# Patient Record
Sex: Female | Born: 1980 | Hispanic: No | Marital: Single | Smoking: Former smoker
Health system: Southern US, Community
[De-identification: ages and names within clinical notes are randomized; demographics above are authoritative.]

## PROBLEM LIST (undated history)

## (undated) DIAGNOSIS — F1910 Other psychoactive substance abuse, uncomplicated: Secondary | ICD-10-CM

## (undated) DIAGNOSIS — I10 Essential (primary) hypertension: Secondary | ICD-10-CM

## (undated) DIAGNOSIS — R011 Cardiac murmur, unspecified: Secondary | ICD-10-CM

## (undated) DIAGNOSIS — I38 Endocarditis, valve unspecified: Secondary | ICD-10-CM

## (undated) HISTORY — PX: CHOLECYSTECTOMY: SHX55

## (undated) HISTORY — PX: MOUTH SURGERY: SHX715

## (undated) HISTORY — PX: TUBAL LIGATION: SHX77

---

## 2008-03-30 ENCOUNTER — Inpatient Hospital Stay (HOSPITAL_COMMUNITY): Admission: AD | Admit: 2008-03-30 | Discharge: 2008-03-30 | Payer: Self-pay | Admitting: Obstetrics and Gynecology

## 2009-08-06 ENCOUNTER — Emergency Department (HOSPITAL_BASED_OUTPATIENT_CLINIC_OR_DEPARTMENT_OTHER): Admission: EM | Admit: 2009-08-06 | Discharge: 2009-08-06 | Payer: Self-pay | Admitting: Emergency Medicine

## 2010-07-26 ENCOUNTER — Emergency Department (HOSPITAL_COMMUNITY)
Admission: EM | Admit: 2010-07-26 | Discharge: 2010-07-26 | Payer: Self-pay | Source: Home / Self Care | Admitting: Emergency Medicine

## 2010-12-22 ENCOUNTER — Emergency Department (HOSPITAL_COMMUNITY)
Admission: EM | Admit: 2010-12-22 | Discharge: 2010-12-22 | Disposition: A | Discharge status: Home / Self Care | Payer: Self-pay | Attending: Emergency Medicine | Admitting: Emergency Medicine

## 2010-12-22 DIAGNOSIS — R1310 Dysphagia, unspecified: Secondary | ICD-10-CM | POA: Insufficient documentation

## 2010-12-22 DIAGNOSIS — X58XXXA Exposure to other specified factors, initial encounter: Secondary | ICD-10-CM | POA: Insufficient documentation

## 2010-12-22 DIAGNOSIS — K0381 Cracked tooth: Secondary | ICD-10-CM | POA: Insufficient documentation

## 2010-12-22 DIAGNOSIS — F319 Bipolar disorder, unspecified: Secondary | ICD-10-CM | POA: Insufficient documentation

## 2010-12-22 DIAGNOSIS — Z79899 Other long term (current) drug therapy: Secondary | ICD-10-CM | POA: Insufficient documentation

## 2010-12-22 DIAGNOSIS — K029 Dental caries, unspecified: Secondary | ICD-10-CM | POA: Insufficient documentation

## 2010-12-22 DIAGNOSIS — S025XXA Fracture of tooth (traumatic), initial encounter for closed fracture: Secondary | ICD-10-CM | POA: Insufficient documentation

## 2010-12-22 DIAGNOSIS — I10 Essential (primary) hypertension: Secondary | ICD-10-CM | POA: Insufficient documentation

## 2011-01-22 ENCOUNTER — Emergency Department (HOSPITAL_COMMUNITY)
Admission: EM | Admit: 2011-01-22 | Discharge: 2011-01-22 | Discharge status: Against Medical Advice | Payer: Self-pay | Attending: Emergency Medicine | Admitting: Emergency Medicine

## 2011-01-22 DIAGNOSIS — Z0389 Encounter for observation for other suspected diseases and conditions ruled out: Secondary | ICD-10-CM | POA: Insufficient documentation

## 2011-02-03 ENCOUNTER — Emergency Department (HOSPITAL_COMMUNITY)
Admission: EM | Admit: 2011-02-03 | Discharge: 2011-02-03 | Disposition: A | Discharge status: Home / Self Care | Payer: No Typology Code available for payment source | Attending: Emergency Medicine | Admitting: Emergency Medicine

## 2011-02-03 DIAGNOSIS — Z79899 Other long term (current) drug therapy: Secondary | ICD-10-CM | POA: Insufficient documentation

## 2011-02-03 DIAGNOSIS — I10 Essential (primary) hypertension: Secondary | ICD-10-CM | POA: Insufficient documentation

## 2011-02-03 DIAGNOSIS — R51 Headache: Secondary | ICD-10-CM | POA: Insufficient documentation

## 2011-02-03 DIAGNOSIS — M542 Cervicalgia: Secondary | ICD-10-CM | POA: Insufficient documentation

## 2011-02-03 DIAGNOSIS — F319 Bipolar disorder, unspecified: Secondary | ICD-10-CM | POA: Insufficient documentation

## 2011-03-01 ENCOUNTER — Inpatient Hospital Stay (INDEPENDENT_AMBULATORY_CARE_PROVIDER_SITE_OTHER)
Admission: RE | Admit: 2011-03-01 | Discharge: 2011-03-01 | Disposition: A | Discharge status: Home / Self Care | Payer: Self-pay | Source: Ambulatory Visit | Attending: Family Medicine | Admitting: Family Medicine

## 2011-03-01 DIAGNOSIS — K089 Disorder of teeth and supporting structures, unspecified: Secondary | ICD-10-CM

## 2011-03-19 ENCOUNTER — Emergency Department (HOSPITAL_COMMUNITY)
Admission: EM | Admit: 2011-03-19 | Discharge: 2011-03-19 | Disposition: A | Discharge status: Home / Self Care | Payer: Self-pay | Attending: Emergency Medicine | Admitting: Emergency Medicine

## 2011-03-19 ENCOUNTER — Emergency Department (HOSPITAL_COMMUNITY): Payer: Self-pay

## 2011-03-19 DIAGNOSIS — R1011 Right upper quadrant pain: Secondary | ICD-10-CM | POA: Insufficient documentation

## 2011-03-19 DIAGNOSIS — R630 Anorexia: Secondary | ICD-10-CM | POA: Insufficient documentation

## 2011-03-19 DIAGNOSIS — R634 Abnormal weight loss: Secondary | ICD-10-CM | POA: Insufficient documentation

## 2011-03-19 DIAGNOSIS — R10811 Right upper quadrant abdominal tenderness: Secondary | ICD-10-CM | POA: Insufficient documentation

## 2011-03-19 DIAGNOSIS — I10 Essential (primary) hypertension: Secondary | ICD-10-CM | POA: Insufficient documentation

## 2011-03-19 DIAGNOSIS — F319 Bipolar disorder, unspecified: Secondary | ICD-10-CM | POA: Insufficient documentation

## 2011-03-19 LAB — CBC
HCT: 31.8 % — ABNORMAL LOW (ref 36.0–46.0)
Hemoglobin: 10.8 g/dL — ABNORMAL LOW (ref 12.0–15.0)
MCH: 29.9 pg (ref 26.0–34.0)
MCV: 88.1 fL (ref 78.0–100.0)
Platelets: 269 10*3/uL (ref 150–400)
RBC: 3.61 MIL/uL — ABNORMAL LOW (ref 3.87–5.11)

## 2011-03-19 LAB — LIPASE, BLOOD: Lipase: 26 U/L (ref 11–59)

## 2011-03-19 LAB — COMPREHENSIVE METABOLIC PANEL
BUN: 11 mg/dL (ref 6–23)
CO2: 26 mEq/L (ref 19–32)
Calcium: 8.8 mg/dL (ref 8.4–10.5)
Chloride: 105 mEq/L (ref 96–112)
Creatinine, Ser: 0.61 mg/dL (ref 0.50–1.10)
GFR calc Af Amer: >60 mL/min (ref >60)
GFR calc non Af Amer: >60 mL/min (ref >60)
Glucose, Bld: 80 mg/dL (ref 70–99)
Total Bilirubin: 0.3 mg/dL (ref 0.3–1.2)

## 2011-03-19 LAB — DIFFERENTIAL
Eosinophils Absolute: 0.1 10*3/uL (ref 0.0–0.7)
Lymphocytes Relative: 45 % (ref 12–46)
Lymphs Abs: 2.9 10*3/uL (ref 0.7–4.0)
Monocytes Relative: 7 % (ref 3–12)
Neutrophils Relative %: 47 % (ref 43–77)

## 2011-03-19 LAB — URINALYSIS, ROUTINE W REFLEX MICROSCOPIC
Glucose, UA: NEGATIVE mg/dL
Hgb urine dipstick: NEGATIVE
Protein, ur: NEGATIVE mg/dL
pH: 7 (ref 5.0–8.0)

## 2011-07-08 ENCOUNTER — Emergency Department (INDEPENDENT_AMBULATORY_CARE_PROVIDER_SITE_OTHER)
Admission: EM | Admit: 2011-07-08 | Discharge: 2011-07-08 | Disposition: A | Discharge status: Home / Self Care | Payer: Self-pay | Source: Home / Self Care | Attending: Family Medicine | Admitting: Family Medicine

## 2011-07-08 ENCOUNTER — Encounter: Payer: Self-pay | Admitting: *Deleted

## 2011-07-08 DIAGNOSIS — S025XXA Fracture of tooth (traumatic), initial encounter for closed fracture: Secondary | ICD-10-CM

## 2011-07-08 DIAGNOSIS — K056 Periodontal disease, unspecified: Secondary | ICD-10-CM

## 2011-07-08 HISTORY — DX: Endocarditis, valve unspecified: I38

## 2011-07-08 HISTORY — DX: Cardiac murmur, unspecified: R01.1

## 2011-07-08 HISTORY — DX: Essential (primary) hypertension: I10

## 2011-07-08 MED ORDER — IBUPROFEN 600 MG PO TABS: 600.0000 mg | ORAL_TABLET | Freq: Three times a day (TID) | ORAL | Status: AC | PRN

## 2011-07-08 MED ORDER — METRONIDAZOLE 500 MG PO TABS: 500.0000 mg | ORAL_TABLET | Freq: Two times a day (BID) | ORAL | Status: AC

## 2011-07-08 MED ORDER — HYDROCODONE-ACETAMINOPHEN 5-500 MG PO TABS: 1.0000 | ORAL_TABLET | Freq: Four times a day (QID) | ORAL | Status: AC | PRN

## 2011-07-08 MED ORDER — AMOXICILLIN 500 MG PO CAPS: 500.0000 mg | ORAL_CAPSULE | Freq: Three times a day (TID) | ORAL | Status: AC

## 2011-07-08 NOTE — ED Notes (Signed)
Pt is here with complaints of left lower jaw tooth pain resulting in left ear pain and HA since 07/06/11.

## 2011-07-09 NOTE — ED Provider Notes (Signed)
History     CSN: [id number]  Arrival date & time 07/08/11  4098   First MD Initiated Contact with Patient 07/08/11 [phone]      Chief Complaint  Patient presents with  . Dental Pain    (Consider location/radiation/quality/duration/timing/severity/associated sxs/prior treatment) HPI Comments: H/o periodontal disease and heart valve disease. Here c/o left lower jaw and dental pain for weeks but worse in last 2 days. Has had several pieces pulled in past not able to see dentist until January, tooth ache triggering headache mild face swelling.     Past Medical History  Diagnosis Date  . Hypertension   . Murmur, heart   . Heart valve regurgitation     Past Surgical History  Procedure Date  . Cesarean section     History reviewed. No pertinent family history.  History  Substance Use Topics  . Smoking status: Current Everyday Smoker  . Smokeless tobacco: Not on file  . Alcohol Use: No    OB History    Grav Para Term Preterm Abortions TAB SAB Ect Mult Living                  Review of Systems  Constitutional: Negative for fever and chills.  HENT: Positive for facial swelling and dental problem. Negative for congestion, sore throat, trouble swallowing, neck pain and sinus pressure.   Respiratory: Negative for cough, chest tightness and shortness of breath.   Cardiovascular: Negative for chest pain, palpitations and leg swelling.  Neurological: Positive for headaches.    Allergies  Review of patient's allergies indicates no known allergies.  Home Medications   Current Outpatient Rx  Name Route Sig Dispense Refill  . ASPIRIN-ACETAMINOPHEN-CAFFEINE 250-250-65 MG PO TABS Oral Take 1 tablet by mouth every 6 (six) hours as needed.      . AMOXICILLIN 500 MG PO CAPS Oral Take 1 capsule (500 mg total) by mouth 3 (three) times daily. 30 capsule 0  . HYDROCODONE-ACETAMINOPHEN 5-500 MG PO TABS Oral Take 1-2 tablets by mouth every 6 (six) hours as needed for pain. 15 tablet 0    . IBUPROFEN 600 MG PO TABS Oral Take 1 tablet (600 mg total) by mouth every 8 (eight) hours as needed for pain. 20 tablet 0  . METRONIDAZOLE 500 MG PO TABS Oral Take 1 tablet (500 mg total) by mouth 2 (two) times daily. 14 tablet 0    BP 135/87  Pulse 90  Temp(Src) 98.2 F (36.8 C) (Oral)  Resp 16  SpO2 99%  LMP 05/30/2011  Physical Exam  Nursing note and vitals reviewed. Constitutional: She is oriented to person, place, and time. She appears well-developed and well-nourished. No distress.  HENT:  Head: Normocephalic and atraumatic.  Right Ear: External ear normal.  Left Ear: External ear normal.  Nose: Nose normal.  Mouth/Throat: Oropharynx is clear and moist. No oropharyngeal exudate.       Poor dentition several pieces missing. Gum swelling and pyorrhea in left lower jaw there is also a fractured molar with yellow exudate inside. Mild lower left face facial edema. No erythema or fluctuations.  Neck: Neck supple.  Cardiovascular: Normal rate and regular rhythm.   Murmur heard. Pulmonary/Chest: Breath sounds normal.  Lymphadenopathy:    She has no cervical adenopathy.  Neurological: She is alert and oriented to person, place, and time.    ED Course  Procedures (including critical care time)  Labs Reviewed - No data to display No results found.   1. Periodontal disease  2. Dental fracture       MDM  Treated with amoxicillin and flagyl. Resource list for dental services provided.        Sharin Grave, MD 07/09/11 [phone]

## 2011-12-07 ENCOUNTER — Emergency Department (HOSPITAL_COMMUNITY)
Admission: EM | Admit: 2011-12-07 | Discharge: 2011-12-07 | Disposition: A | Discharge status: Home / Self Care | Payer: Self-pay | Attending: Emergency Medicine | Admitting: Emergency Medicine

## 2011-12-07 ENCOUNTER — Encounter (HOSPITAL_COMMUNITY): Payer: Self-pay

## 2011-12-07 DIAGNOSIS — I10 Essential (primary) hypertension: Secondary | ICD-10-CM | POA: Insufficient documentation

## 2011-12-07 DIAGNOSIS — M5430 Sciatica, unspecified side: Secondary | ICD-10-CM | POA: Insufficient documentation

## 2011-12-07 DIAGNOSIS — M5432 Sciatica, left side: Secondary | ICD-10-CM

## 2011-12-07 DIAGNOSIS — M79609 Pain in unspecified limb: Secondary | ICD-10-CM | POA: Insufficient documentation

## 2011-12-07 MED ORDER — KETOROLAC TROMETHAMINE 60 MG/2ML IM SOLN
60.0000 mg | Freq: Once | INTRAMUSCULAR | Status: AC
  Administered 2011-12-07: 60 mg via INTRAMUSCULAR
  Filled 2011-12-07: qty 2

## 2011-12-07 NOTE — ED Notes (Signed)
Pt sts her whole left side hurts. Feels like that side is "dead" and gets a tingling feeling in it. Her knee and ankle hurt.

## 2011-12-07 NOTE — Discharge Instructions (Signed)
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
## 2011-12-07 NOTE — ED Provider Notes (Signed)
I have seen and examined this patient with the resident.  I agree with the resident's note, assessment and plan except as indicated.    Patient with symptoms consistent with mild sciatica.  She is Re: received an MRI in the past and has no acute neurologic deficits at this time.  We'll treat her pain here and advised followup as she had already begun the process previously.  Nat Christen, MD 12/07/11 [phone]

## 2012-02-16 ENCOUNTER — Emergency Department (HOSPITAL_COMMUNITY)
Admission: EM | Admit: 2012-02-16 | Discharge: 2012-02-17 | Disposition: A | Discharge status: Home / Self Care | Payer: Self-pay | Attending: Emergency Medicine | Admitting: Emergency Medicine

## 2012-02-16 ENCOUNTER — Encounter (HOSPITAL_COMMUNITY): Payer: Self-pay | Admitting: Emergency Medicine

## 2012-02-16 DIAGNOSIS — F17200 Nicotine dependence, unspecified, uncomplicated: Secondary | ICD-10-CM | POA: Insufficient documentation

## 2012-02-16 DIAGNOSIS — I10 Essential (primary) hypertension: Secondary | ICD-10-CM | POA: Insufficient documentation

## 2012-02-16 DIAGNOSIS — K089 Disorder of teeth and supporting structures, unspecified: Secondary | ICD-10-CM | POA: Insufficient documentation

## 2012-02-16 DIAGNOSIS — K0889 Other specified disorders of teeth and supporting structures: Secondary | ICD-10-CM

## 2012-02-16 MED ORDER — HYDROCODONE-ACETAMINOPHEN 5-500 MG PO TABS: 1.0000 | ORAL_TABLET | Freq: Four times a day (QID) | ORAL | Status: AC | PRN

## 2012-02-16 MED ORDER — HYDROCODONE-ACETAMINOPHEN 5-325 MG PO TABS
1.0000 | ORAL_TABLET | Freq: Once | ORAL | Status: AC
  Administered 2012-02-16: 1 via ORAL
  Filled 2012-02-16: qty 1

## 2012-02-16 NOTE — ED Notes (Signed)
PT. REPORTS MULTIPLE LEFT UPPER/LOWER TOOTH ACHE FOR 2 WEEKS UNRELIEVED BY OTC MEDICATIONS.

## 2012-02-16 NOTE — ED Notes (Signed)
Patient presents stating she has been having a lot of dental pain to the left upper and lower teeth.  Teeth broken and some decayed. Holds warm blanket in front of her mouth because the cold air makes it hurt worse.

## 2012-02-16 NOTE — ED Provider Notes (Signed)
History     CSN: [id number]  Arrival date & time 02/16/12  2113   First MD Initiated Contact with Patient 02/16/12 2315      Chief Complaint  Patient presents with  . Dental Pain     HPI  This female presents with 2 weeks of dental pain.  She notes that her pain is minimally responsive to OTC medication.  She notes no ongoing fevers, chills, nausea, headache, confusion, disorientation, chest pain, dyspnea. She has not seen a dentist yet.  Past Medical History  Diagnosis Date  . Hypertension   . Murmur, heart   . Heart valve regurgitation     Past Surgical History  Procedure Date  . Cesarean section   . Cholecystectomy     No family history on file.  History  Substance Use Topics  . Smoking status: Current Everyday Smoker -- 0.5 packs/day  . Smokeless tobacco: Not on file  . Alcohol Use: No    OB History    Grav Para Term Preterm Abortions TAB SAB Ect Mult Living                  Review of Systems  Constitutional: Negative for fever and chills.  HENT: Positive for facial swelling. Negative for neck pain and neck stiffness.   Eyes: Negative.   Respiratory: Negative for cough and chest tightness.   Cardiovascular: Negative for chest pain.  Gastrointestinal: Negative.     Allergies  Review of patient's allergies indicates no known allergies.  Home Medications   Current Outpatient Rx  Name Route Sig Dispense Refill  . ACETAMINOPHEN 500 MG PO TABS Oral Take 1,000 mg by mouth every 6 (six) hours as needed. For pain    . ASPIRIN-ACETAMINOPHEN-CAFFEINE 250-250-65 MG PO TABS Oral Take 1 tablet by mouth every 6 (six) hours as needed. For pain    . IBUPROFEN 200 MG PO TABS Oral Take 600 mg by mouth every 6 (six) hours as needed. For pain    . HYDROCODONE-ACETAMINOPHEN 5-500 MG PO TABS Oral Take 1 tablet by mouth every 6 (six) hours as needed for pain. 15 tablet 0    BP 110/84  Pulse 98  Temp 98.9 F (37.2 C) (Oral)  Resp 14  SpO2 99%  LMP  02/10/2012  Physical Exam  Nursing note and vitals reviewed. Constitutional: She is oriented to person, place, and time. She appears well-developed and well-nourished. No distress.  HENT:  Head: Normocephalic and atraumatic.       Very poor condition of that the left side, with multiple fractures about both the upper and lower teeth, with no visible pulp. There is no surrounding erythema, or fluctuance about either, line.  The patient is tolerating her secretions  Eyes: Pupils are equal, round, and reactive to light. Left eye exhibits no discharge.  Neck: Neck supple.  Pulmonary/Chest: Effort normal. No stridor. No respiratory distress.  Neurological: She is alert and oriented to person, place, and time. No cranial nerve deficit.  Psychiatric: She has a normal mood and affect.    ED Course  Procedures (including critical care time)  Labs Reviewed - No data to display No results found.   1. Pain, dental       MDM  This young female presents with ongoing dental pain.  On exam the patient is uncomfortable, though in no distress.  The patient is not tachycardic, vital signs are otherwise unremarkable as well.  Given that the patient has seen a dentist, I spent sometime  discussing the need for definitive care with her and her companion.  She received oral analgesics, and was discharged with exquisite instructions to followup with dental as soon as possible    Gerhard Munch, MD 02/16/12 2350

## 2012-02-17 NOTE — ED Notes (Signed)
Pt states understanding of discharge instructions 

## 2012-08-23 ENCOUNTER — Encounter (HOSPITAL_COMMUNITY): Payer: Self-pay | Admitting: Adult Health

## 2012-08-23 DIAGNOSIS — F17200 Nicotine dependence, unspecified, uncomplicated: Secondary | ICD-10-CM | POA: Insufficient documentation

## 2012-08-23 DIAGNOSIS — N83209 Unspecified ovarian cyst, unspecified side: Secondary | ICD-10-CM | POA: Insufficient documentation

## 2012-08-23 DIAGNOSIS — M545 Low back pain, unspecified: Secondary | ICD-10-CM | POA: Insufficient documentation

## 2012-08-23 DIAGNOSIS — R011 Cardiac murmur, unspecified: Secondary | ICD-10-CM | POA: Insufficient documentation

## 2012-08-23 DIAGNOSIS — Z3202 Encounter for pregnancy test, result negative: Secondary | ICD-10-CM | POA: Insufficient documentation

## 2012-08-23 DIAGNOSIS — R350 Frequency of micturition: Secondary | ICD-10-CM | POA: Insufficient documentation

## 2012-08-23 DIAGNOSIS — Z8679 Personal history of other diseases of the circulatory system: Secondary | ICD-10-CM | POA: Insufficient documentation

## 2012-08-23 DIAGNOSIS — I10 Essential (primary) hypertension: Secondary | ICD-10-CM | POA: Insufficient documentation

## 2012-08-23 DIAGNOSIS — N898 Other specified noninflammatory disorders of vagina: Secondary | ICD-10-CM | POA: Insufficient documentation

## 2012-08-23 LAB — URINALYSIS, ROUTINE W REFLEX MICROSCOPIC
Ketones, ur: 15 mg/dL — AB
Protein, ur: NEGATIVE mg/dL
Urobilinogen, UA: 1 mg/dL (ref 0.0–1.0)

## 2012-08-23 LAB — URINE MICROSCOPIC-ADD ON

## 2012-08-23 NOTE — ED Notes (Signed)
Presents with Dark urine, lower abdominal pain and lower back pain,  Urinating small amounts at a time, frequency and vaginal discharge started last night. LMP : 08-18-12

## 2012-08-24 ENCOUNTER — Emergency Department (HOSPITAL_COMMUNITY): Payer: Self-pay

## 2012-08-24 ENCOUNTER — Emergency Department (HOSPITAL_COMMUNITY)
Admission: EM | Admit: 2012-08-24 | Discharge: 2012-08-24 | Disposition: A | Discharge status: Home / Self Care | Payer: Self-pay | Attending: Emergency Medicine | Admitting: Emergency Medicine

## 2012-08-24 DIAGNOSIS — N83201 Unspecified ovarian cyst, right side: Secondary | ICD-10-CM

## 2012-08-24 LAB — WET PREP, GENITAL
Trich, Wet Prep: NONE SEEN
Yeast Wet Prep HPF POC: NONE SEEN

## 2012-08-24 MED ORDER — OXYCODONE-ACETAMINOPHEN 5-325 MG PO TABS
2.0000 | ORAL_TABLET | Freq: Once | ORAL | Status: AC
  Administered 2012-08-24: 2 via ORAL
  Filled 2012-08-24: qty 2

## 2012-08-24 MED ORDER — IBUPROFEN 800 MG PO TABS
800.0000 mg | ORAL_TABLET | Freq: Once | ORAL | Status: AC
  Administered 2012-08-24: 800 mg via ORAL
  Filled 2012-08-24: qty 1

## 2012-08-24 MED ORDER — OXYCODONE-ACETAMINOPHEN 5-325 MG PO TABS: 2.0000 | ORAL_TABLET | ORAL | Status: DC | PRN

## 2012-08-24 NOTE — ED Provider Notes (Signed)
History     CSN: [id number]  Arrival date & time 08/23/12  2021   First MD Initiated Contact with Patient 08/24/12 0153      Chief Complaint  Patient presents with  . Urinary Tract Infection    (Consider location/radiation/quality/duration/timing/severity/associated sxs/prior treatment) HPI 32 yo female presents to the ER from home with complaint of 1 day of lower abdominal pressure, increased frequency of urination, dark urine, and lower back pain.  LMP 1/31.  No fever, no n/v.  No new sexual partners.  Small amount of vaginal discharge without order.    Past Medical History  Diagnosis Date  . Hypertension   . Murmur, heart   . Heart valve regurgitation     Past Surgical History  Procedure Date  . Cesarean section   . Cholecystectomy     History reviewed. No pertinent family history.  History  Substance Use Topics  . Smoking status: Current Every Day Smoker -- 0.5 packs/day  . Smokeless tobacco: Not on file  . Alcohol Use: No    OB History    Grav Para Term Preterm Abortions TAB SAB Ect Mult Living                  Review of Systems  All other systems reviewed and are negative.    Allergies  Review of patient's allergies indicates no known allergies.  Home Medications   Current Outpatient Rx  Name  Route  Sig  Dispense  Refill  . ACETAMINOPHEN 500 MG PO TABS   Oral   Take 1,000 mg by mouth every 6 (six) hours as needed. For pain         . ASPIRIN-ACETAMINOPHEN-CAFFEINE 250-250-65 MG PO TABS   Oral   Take 1 tablet by mouth every 6 (six) hours as needed. For pain         . IBUPROFEN 800 MG PO TABS   Oral   Take 800 mg by mouth every 8 (eight) hours as needed. For pain         . OXYCODONE-ACETAMINOPHEN 5-325 MG PO TABS   Oral   Take 2 tablets by mouth every 4 (four) hours as needed for pain.   20 tablet   0     BP 130/78  Pulse 88  Temp 98.3 F (36.8 C) (Oral)  Resp 20  SpO2 98%  Physical Exam  Nursing note and vitals  reviewed. Constitutional: She is oriented to person, place, and time. She appears well-developed and well-nourished.  HENT:  Head: Normocephalic and atraumatic.  Nose: Nose normal.  Mouth/Throat: Oropharynx is clear and moist.  Eyes: Conjunctivae normal and EOM are normal. Pupils are equal, round, and reactive to light.  Neck: Normal range of motion. Neck supple. No JVD present. No tracheal deviation present. No thyromegaly present.  Cardiovascular: Normal rate, regular rhythm, normal heart sounds and intact distal pulses.  Exam reveals no gallop and no friction rub.   No murmur heard. Pulmonary/Chest: Effort normal and breath sounds normal. No stridor. No respiratory distress. She has no wheezes. She has no rales. She exhibits no tenderness.  Abdominal: Soft. Bowel sounds are normal. She exhibits no distension and no mass. There is tenderness (ttp over suprapubic region). There is no rebound and no guarding.  Genitourinary:       External genitalia normal Vagina with scant whilte discharge Cervix closed no lesions No cervical motion tenderness Adnexa palpated, no masses but ttp in right adnexa otherwise no tenderness noted Bladder palpated no  tenderness Uterus palpated no masses or tenderness    Musculoskeletal: Normal range of motion. She exhibits no edema and no tenderness.  Lymphadenopathy:    She has no cervical adenopathy.  Neurological: She is alert and oriented to person, place, and time. She exhibits normal muscle tone. Coordination normal.  Skin: Skin is warm and dry. No rash noted. No erythema. No pallor.  Psychiatric: She has a normal mood and affect. Her behavior is normal. Judgment and thought content normal.    ED Course  Procedures (including critical care time)  Labs Reviewed  URINALYSIS, ROUTINE W REFLEX MICROSCOPIC - Abnormal; Notable for the following:    Color, Urine AMBER (*)  BIOCHEMICALS MAY BE AFFECTED BY COLOR   APPearance CLOUDY (*)     Bilirubin Urine  SMALL (*)     Ketones, ur 15 (*)     Leukocytes, UA TRACE (*)     All other components within normal limits  URINE MICROSCOPIC-ADD ON - Abnormal; Notable for the following:    Squamous Epithelial / LPF FEW (*)     Bacteria, UA FEW (*)     All other components within normal limits  WET PREP, GENITAL - Abnormal; Notable for the following:    Clue Cells Wet Prep HPF POC MODERATE (*)     WBC, Wet Prep HPF POC FEW (*)     All other components within normal limits  POCT PREGNANCY, URINE  GC/CHLAMYDIA PROBE AMP   US Transvaginal Non-ob  08/24/2012  *RADIOLOGY REPORT*  Clinical Data:  Right adnexal pain and back pain.  TRANSABDOMINAL AND TRANSVAGINAL ULTRASOUND OF PELVIS DOPPLER ULTRASOUND OF OVARIES  Technique:  Both transabdominal and transvaginal ultrasound examinations of the pelvis were performed. Transabdominal technique was performed for global imaging of the pelvis including uterus, ovaries, adnexal regions, and pelvic cul-de-sac.  It was necessary to proceed with endovaginal exam following the transabdominal exam to visualize the endometrium and ovaries.  Color and duplex Doppler ultrasound was utilized to evaluate blood flow to the ovaries.  Comparison:  None.  Findings:  Uterus:  The uterus is retroverted and measures about 8 x 3.3 x 4.3 cm.  No focal myometrial mass lesions identified.  Endometrium:  Normal endometrial stripe thickness measured at 3.4 mm.  No abnormal endometrial fluid collections.  Right ovary: The right ovary measures 3.6 x 2.1 x 2.5 cm and contains a cyst with maximal diameter of about 1.3 cm.  This is likely a functional cyst.  No abnormal adnexal mass lesions are appreciated.  Flow is demonstrated in the right ovary on color flow Doppler imaging.  Left ovary:   The left ovary measures 2.9 x 1.4 x 3.3 cm.  No abnormal adnexal mass lesions are demonstrated.  Flow is demonstrated in the left ovary on color flow Doppler imaging.  No free pelvic fluid collections.  Pulsed Doppler  evaluation demonstrates normal low-resistance arterial and venous waveforms in both ovaries.  IMPRESSION: Normal exam.  No evidence of pelvic mass or other significant abnormality.  No sonographic evidence for ovarian torsion.   Original Report Authenticated By: Burman Nieves, M.D.    US Pelvis Complete  08/24/2012  *RADIOLOGY REPORT*  Clinical Data:  Right adnexal pain and back pain.  TRANSABDOMINAL AND TRANSVAGINAL ULTRASOUND OF PELVIS DOPPLER ULTRASOUND OF OVARIES  Technique:  Both transabdominal and transvaginal ultrasound examinations of the pelvis were performed. Transabdominal technique was performed for global imaging of the pelvis including uterus, ovaries, adnexal regions, and pelvic cul-de-sac.  It was necessary  to proceed with endovaginal exam following the transabdominal exam to visualize the endometrium and ovaries.  Color and duplex Doppler ultrasound was utilized to evaluate blood flow to the ovaries.  Comparison:  None.  Findings:  Uterus:  The uterus is retroverted and measures about 8 x 3.3 x 4.3 cm.  No focal myometrial mass lesions identified.  Endometrium:  Normal endometrial stripe thickness measured at 3.4 mm.  No abnormal endometrial fluid collections.  Right ovary: The right ovary measures 3.6 x 2.1 x 2.5 cm and contains a cyst with maximal diameter of about 1.3 cm.  This is likely a functional cyst.  No abnormal adnexal mass lesions are appreciated.  Flow is demonstrated in the right ovary on color flow Doppler imaging.  Left ovary:   The left ovary measures 2.9 x 1.4 x 3.3 cm.  No abnormal adnexal mass lesions are demonstrated.  Flow is demonstrated in the left ovary on color flow Doppler imaging.  No free pelvic fluid collections.  Pulsed Doppler evaluation demonstrates normal low-resistance arterial and venous waveforms in both ovaries.  IMPRESSION: Normal exam.  No evidence of pelvic mass or other significant abnormality.  No sonographic evidence for ovarian torsion.   Original  Report Authenticated By: Burman Nieves, M.D.    [place] Art/ven Flow Abd Pelv Doppler  08/24/2012  *RADIOLOGY REPORT*  Clinical Data:  Right adnexal pain and back pain.  TRANSABDOMINAL AND TRANSVAGINAL ULTRASOUND OF PELVIS DOPPLER ULTRASOUND OF OVARIES  Technique:  Both transabdominal and transvaginal ultrasound examinations of the pelvis were performed. Transabdominal technique was performed for global imaging of the pelvis including uterus, ovaries, adnexal regions, and pelvic cul-de-sac.  It was necessary to proceed with endovaginal exam following the transabdominal exam to visualize the endometrium and ovaries.  Color and duplex Doppler ultrasound was utilized to evaluate blood flow to the ovaries.  Comparison:  None.  Findings:  Uterus:  The uterus is retroverted and measures about 8 x 3.3 x 4.3 cm.  No focal myometrial mass lesions identified.  Endometrium:  Normal endometrial stripe thickness measured at 3.4 mm.  No abnormal endometrial fluid collections.  Right ovary: The right ovary measures 3.6 x 2.1 x 2.5 cm and contains a cyst with maximal diameter of about 1.3 cm.  This is likely a functional cyst.  No abnormal adnexal mass lesions are appreciated.  Flow is demonstrated in the right ovary on color flow Doppler imaging.  Left ovary:   The left ovary measures 2.9 x 1.4 x 3.3 cm.  No abnormal adnexal mass lesions are demonstrated.  Flow is demonstrated in the left ovary on color flow Doppler imaging.  No free pelvic fluid collections.  Pulsed Doppler evaluation demonstrates normal low-resistance arterial and venous waveforms in both ovaries.  IMPRESSION: Normal exam.  No evidence of pelvic mass or other significant abnormality.  No sonographic evidence for ovarian torsion.   Original Report Authenticated By: Burman Nieves, M.D.      1. Right ovarian cyst       MDM  31 year old female with urinary tract infection-like symptoms with frequency, urgency and lower abdominal pressure. Her UA does not  indicate infection. Some tenderness in the right adnexa on pelvic exam. Ovarian cyst noted on ultrasound. Patient is more comfortable now after medications, and has a GYN that she plans to followup with.        Olivia Mackie, MD 08/24/12 2030

## 2012-08-24 NOTE — ED Notes (Signed)
Pt. Also complains of mild headache

## 2012-08-25 LAB — GC/CHLAMYDIA PROBE AMP
CT Probe RNA: NEGATIVE
GC Probe RNA: NEGATIVE

## 2012-09-01 ENCOUNTER — Emergency Department (HOSPITAL_COMMUNITY)
Admission: EM | Admit: 2012-09-01 | Discharge: 2012-09-02 | Disposition: A | Discharge status: Home / Self Care | Payer: Self-pay | Attending: Emergency Medicine | Admitting: Emergency Medicine

## 2012-09-01 ENCOUNTER — Encounter (HOSPITAL_COMMUNITY): Payer: Self-pay | Admitting: Emergency Medicine

## 2012-09-01 DIAGNOSIS — R197 Diarrhea, unspecified: Secondary | ICD-10-CM | POA: Insufficient documentation

## 2012-09-01 DIAGNOSIS — R011 Cardiac murmur, unspecified: Secondary | ICD-10-CM | POA: Insufficient documentation

## 2012-09-01 DIAGNOSIS — I10 Essential (primary) hypertension: Secondary | ICD-10-CM | POA: Insufficient documentation

## 2012-09-01 DIAGNOSIS — Z9089 Acquired absence of other organs: Secondary | ICD-10-CM | POA: Insufficient documentation

## 2012-09-01 DIAGNOSIS — R1011 Right upper quadrant pain: Secondary | ICD-10-CM | POA: Insufficient documentation

## 2012-09-01 DIAGNOSIS — R109 Unspecified abdominal pain: Secondary | ICD-10-CM

## 2012-09-01 DIAGNOSIS — R5381 Other malaise: Secondary | ICD-10-CM | POA: Insufficient documentation

## 2012-09-01 DIAGNOSIS — Z90710 Acquired absence of both cervix and uterus: Secondary | ICD-10-CM | POA: Insufficient documentation

## 2012-09-01 DIAGNOSIS — I38 Endocarditis, valve unspecified: Secondary | ICD-10-CM | POA: Insufficient documentation

## 2012-09-01 DIAGNOSIS — F17200 Nicotine dependence, unspecified, uncomplicated: Secondary | ICD-10-CM | POA: Insufficient documentation

## 2012-09-01 DIAGNOSIS — Z3202 Encounter for pregnancy test, result negative: Secondary | ICD-10-CM | POA: Insufficient documentation

## 2012-09-01 LAB — URINALYSIS, ROUTINE W REFLEX MICROSCOPIC
Glucose, UA: NEGATIVE mg/dL
Hgb urine dipstick: NEGATIVE
Leukocytes, UA: NEGATIVE
pH: 7 (ref 5.0–8.0)

## 2012-09-01 LAB — COMPREHENSIVE METABOLIC PANEL
ALT: 5 U/L (ref 0–35)
AST: 12 U/L (ref 0–37)
Albumin: 3.6 g/dL (ref 3.5–5.2)
CO2: 24 mEq/L (ref 19–32)
Calcium: 9.1 mg/dL (ref 8.4–10.5)
Chloride: 103 mEq/L (ref 96–112)
Creatinine, Ser: 0.55 mg/dL (ref 0.50–1.10)
GFR calc non Af Amer: >90 mL/min (ref >90)
Sodium: 134 mEq/L — ABNORMAL LOW (ref 135–145)

## 2012-09-01 LAB — CBC WITH DIFFERENTIAL/PLATELET
Basophils Absolute: 0.1 10*3/uL (ref 0.0–0.1)
Basophils Relative: 1 % (ref 0–1)
Eosinophils Relative: 2 % (ref 0–5)
Lymphocytes Relative: 39 % (ref 12–46)
MCHC: 33.1 g/dL (ref 30.0–36.0)
Monocytes Absolute: 0.8 10*3/uL (ref 0.1–1.0)
Neutro Abs: 5.5 10*3/uL (ref 1.7–7.7)
Platelets: 298 10*3/uL (ref 150–400)
RDW: 13.6 % (ref 11.5–15.5)
WBC: 10.7 10*3/uL — ABNORMAL HIGH (ref 4.0–10.5)

## 2012-09-01 LAB — POCT PREGNANCY, URINE: Preg Test, Ur: NEGATIVE

## 2012-09-01 NOTE — ED Notes (Signed)
Pt does have small child with her. States she has no one to come get child. Pt states she is unable to get HTN meds to funding.

## 2012-09-01 NOTE — ED Notes (Signed)
Pt c/o RUQ pain x 2 months, pt alternating diarrhea and constipation. Pt seen at [facility] 2/5 for same.

## 2012-09-02 ENCOUNTER — Emergency Department (HOSPITAL_COMMUNITY): Payer: Self-pay

## 2012-09-02 MED ORDER — SODIUM CHLORIDE 0.9 % IV BOLUS (SEPSIS)
1000.0000 mL | Freq: Once | INTRAVENOUS | Status: AC
  Administered 2012-09-02: 1000 mL via INTRAVENOUS

## 2012-09-02 MED ORDER — KETOROLAC TROMETHAMINE 30 MG/ML IJ SOLN
30.0000 mg | Freq: Once | INTRAMUSCULAR | Status: AC
  Administered 2012-09-02: 30 mg via INTRAVENOUS
  Filled 2012-09-02: qty 1

## 2012-09-02 NOTE — ED Provider Notes (Signed)
Medical screening examination/treatment/procedure(s) were performed by non-physician practitioner and as supervising physician I was immediately available for consultation/collaboration.   Lyanne Co, MD 09/02/12 [phone]

## 2012-09-02 NOTE — ED Provider Notes (Addendum)
History     CSN: [id number]  Arrival date & time 09/01/12  2146   First MD Initiated Contact with Patient 09/02/12 [phone]      Chief Complaint  Patient presents with  . Abdominal Pain    (Consider location/radiation/quality/duration/timing/severity/associated sxs/prior treatment) HPI Comments: 32 y/o female with a history of cholecystectomy in 2010 presents to the ED complaining of RUQ abdominal pain x 2 months. Describes the pain as sharp, non-radiating and cramping rated 7/10. She was seen in the ED on 2/5 and was diagnosed with a right ovarian cyst. Patient states this pain is different from when she was seen prior, however states she did mention this pain at that time. Pelvic US was obtained at that visit along with UA and pelvic exam which did not show infection. She has tried taking percocet and tylenol which both make her pain worse. Nothing in specific alleviates her pain. Eating and drinking soda also exacerbate pain. Admits to associated alternating diarrhea and constipation. Denies bloody stools, nausea or vomiting, fever or chills.  Patient is a 32 y.o. female presenting with abdominal pain. The history is provided by the patient.  Abdominal Pain Associated symptoms: constipation, diarrhea and fatigue   Associated symptoms: no chills, no dysuria, no fever, no nausea, no vaginal bleeding, no vaginal discharge and no vomiting     Past Medical History  Diagnosis Date  . Hypertension   . Murmur, heart   . Heart valve regurgitation     Past Surgical History  Procedure Laterality Date  . Cesarean section    . Cholecystectomy      No family history on file.  History  Substance Use Topics  . Smoking status: Current Every Day Smoker -- 0.50 packs/day  . Smokeless tobacco: Not on file  . Alcohol Use: No    OB History   Grav Para Term Preterm Abortions TAB SAB Ect Mult Living                  Review of Systems  Constitutional: Positive for appetite change and  fatigue. Negative for fever and chills.  Gastrointestinal: Positive for abdominal pain, diarrhea and constipation. Negative for nausea, vomiting and blood in stool.  Genitourinary: Negative for dysuria, urgency, frequency, vaginal bleeding, vaginal discharge and pelvic pain.  Musculoskeletal: Negative for back pain.  Neurological: Negative for weakness.  All other systems reviewed and are negative.    Allergies  Acetaminophen  Home Medications   Current Outpatient Rx  Name  Route  Sig  Dispense  Refill  . aspirin-acetaminophen-caffeine (EXCEDRIN MIGRAINE) 250-250-65 MG per tablet   Oral   Take 1 tablet by mouth every 6 (six) hours as needed. For pain         . ibuprofen (ADVIL,MOTRIN) 800 MG tablet   Oral   Take 800 mg by mouth every 8 (eight) hours as needed. For pain           BP 139/90  Pulse 89  Temp(Src) 98.1 F (36.7 C) (Oral)  Resp 18  Ht 4\' 9"  (1.448 m)  Wt 94 lb (42.638 kg)  BMI 20.34 kg/m2  SpO2 100%  LMP 08/12/2012  Physical Exam  Nursing note and vitals reviewed. Constitutional: She is oriented to person, place, and time. She appears well-developed and well-nourished. No distress.  HENT:  Head: Normocephalic and atraumatic.  Mouth/Throat: Oropharynx is clear and moist.  Eyes: Conjunctivae are normal. No scleral icterus.  Neck: Normal range of motion. Neck supple.  Cardiovascular: Normal rate,  regular rhythm and normal heart sounds.   Pulmonary/Chest: Effort normal and breath sounds normal.  Abdominal: Normal appearance and bowel sounds are normal. She exhibits no distension and no mass. There is tenderness in the right upper quadrant. There is guarding and positive Murphy's sign. There is no rigidity and no rebound.  Neurological: She is alert and oriented to person, place, and time.  Skin: Skin is warm and dry.  Psychiatric: She has a normal mood and affect. Her behavior is normal.    ED Course  Procedures (including critical care time)  Labs  Reviewed  CBC WITH DIFFERENTIAL - Abnormal; Notable for the following:    WBC 10.7 (*)    Hemoglobin 11.7 (*)    HCT 35.3 (*)    Lymphs Abs 4.2 (*)    All other components within normal limits  COMPREHENSIVE METABOLIC PANEL - Abnormal; Notable for the following:    Sodium 134 (*)    Total Bilirubin 0.1 (*)    All other components within normal limits  LIPASE, BLOOD  URINALYSIS, ROUTINE W REFLEX MICROSCOPIC  POCT PREGNANCY, URINE   US Abdomen Complete  09/02/2012  *RADIOLOGY REPORT*  Clinical Data:  Right upper quadrant abdominal pain.  ABDOMINAL ULTRASOUND COMPLETE  Comparison:  Abdominal ultrasound performed 03/19/2011  Findings:  Gallbladder:  The patient is status post cholecystectomy; no retained stones are seen.  Common Bile Duct:  0.5 cm in diameter; within normal limits in caliber.  Liver:  Normal parenchymal echogenicity and echotexture; no focal lesions identified.  Limited Doppler evaluation demonstrates normal blood flow within the liver.  IVC:  Unremarkable in appearance.  Pancreas:  Although the pancreas is difficult to visualize in its entirety due to overlying bowel gas, no focal pancreatic abnormality is identified.  Spleen:  8.3 cm in length; within normal limits in size and echotexture.  Right kidney:  11.0 cm in length; normal in size, configuration and parenchymal echogenicity.  No evidence of mass or hydronephrosis.  Left kidney:  11.4 cm in length; normal in size, configuration and parenchymal echogenicity.  No evidence of mass or hydronephrosis. Mild left-sided pelvicaliectasis remains within normal limits.  Abdominal Aorta:  Normal in caliber; no aneurysm identified.  IMPRESSION: Unremarkable abdominal ultrasound.  Status post cholecystectomy.   Original Report Authenticated By: Tonia Ghent, M.D.      1. Abdominal pain       MDM  Abdominal US and labs unremarkable. She was advised to f/u with her gyn at her last visit which she has not. I discussed importance of  this. Concerning her RUQ abdominal pain she will f/u with GI. Advised her to no longer take percocet due to worsening pain. She will take ibuprofen. Decreased tenderness to palpation of RUQ on re-examination. Stable for discharge. Return precautions discussed. Patient states understanding of plan and is agreeable.         Trevor Mace, PA-C 09/02/12 0305  Trevor Mace, PA-C 09/10/12 [phone]

## 2012-09-02 NOTE — Discharge Instructions (Signed)
Follow up with [facility] as discussed. Your labs and abdominal ultrasound today were normal. You still need to follow up with your Gynecologist. Return with worsening symptoms. Take ibuprofen 600-800mg  for pain.  Abdominal Pain Abdominal pain can be caused by many things. Your caregiver decides the seriousness of your pain by an examination and possibly blood tests and X-rays. Many cases can be observed and treated at home. Most abdominal pain is not caused by a disease and will probably improve without treatment. However, in many cases, more time must pass before a clear cause of the pain can be found. Before that point, it may not be known if you need more testing, or if hospitalization or surgery is needed. HOME CARE INSTRUCTIONS   Do not take laxatives unless directed by your caregiver.  Take pain medicine only as directed by your caregiver.  Only take over-the-counter or prescription medicines for pain, discomfort, or fever as directed by your caregiver.  Try a clear liquid diet (broth, tea, or water) for as long as directed by your caregiver. Slowly move to a bland diet as tolerated. SEEK IMMEDIATE MEDICAL CARE IF:   The pain does not go away.  You have a fever.  You keep throwing up (vomiting).  The pain is felt only in portions of the abdomen. Pain in the right side could possibly be appendicitis. In an adult, pain in the left lower portion of the abdomen could be colitis or diverticulitis.  You pass bloody or black tarry stools. MAKE SURE YOU:   Understand these instructions.  Will watch your condition.  Will get help right away if you are not doing well or get worse. Document Released: 04/14/2005 Document Revised: 09/27/2011 Document Reviewed: 02/21/2008 [facility] Patient Information 2013 [place], [place].

## 2012-09-11 NOTE — ED Provider Notes (Signed)
Medical screening examination/treatment/procedure(s) were performed by non-physician practitioner and as supervising physician I was immediately available for consultation/collaboration.   Lyanne Co, MD 09/11/12 [phone]

## 2013-02-02 ENCOUNTER — Encounter (HOSPITAL_COMMUNITY): Payer: Self-pay | Admitting: Cardiology

## 2013-02-02 ENCOUNTER — Emergency Department (HOSPITAL_COMMUNITY)
Admission: EM | Admit: 2013-02-02 | Discharge: 2013-02-02 | Disposition: A | Discharge status: Home / Self Care | Payer: Self-pay | Attending: Emergency Medicine | Admitting: Emergency Medicine

## 2013-02-02 DIAGNOSIS — I10 Essential (primary) hypertension: Secondary | ICD-10-CM | POA: Insufficient documentation

## 2013-02-02 DIAGNOSIS — N949 Unspecified condition associated with female genital organs and menstrual cycle: Secondary | ICD-10-CM | POA: Insufficient documentation

## 2013-02-02 DIAGNOSIS — Z79899 Other long term (current) drug therapy: Secondary | ICD-10-CM | POA: Insufficient documentation

## 2013-02-02 DIAGNOSIS — R110 Nausea: Secondary | ICD-10-CM | POA: Insufficient documentation

## 2013-02-02 DIAGNOSIS — Z3202 Encounter for pregnancy test, result negative: Secondary | ICD-10-CM | POA: Insufficient documentation

## 2013-02-02 DIAGNOSIS — F17200 Nicotine dependence, unspecified, uncomplicated: Secondary | ICD-10-CM | POA: Insufficient documentation

## 2013-02-02 DIAGNOSIS — N898 Other specified noninflammatory disorders of vagina: Secondary | ICD-10-CM | POA: Insufficient documentation

## 2013-02-02 DIAGNOSIS — N939 Abnormal uterine and vaginal bleeding, unspecified: Secondary | ICD-10-CM

## 2013-02-02 DIAGNOSIS — Z8679 Personal history of other diseases of the circulatory system: Secondary | ICD-10-CM | POA: Insufficient documentation

## 2013-02-02 DIAGNOSIS — R109 Unspecified abdominal pain: Secondary | ICD-10-CM | POA: Insufficient documentation

## 2013-02-02 DIAGNOSIS — R5381 Other malaise: Secondary | ICD-10-CM | POA: Insufficient documentation

## 2013-02-02 DIAGNOSIS — R011 Cardiac murmur, unspecified: Secondary | ICD-10-CM | POA: Insufficient documentation

## 2013-02-02 LAB — URINALYSIS, ROUTINE W REFLEX MICROSCOPIC
Glucose, UA: NEGATIVE mg/dL
Ketones, ur: NEGATIVE mg/dL
Leukocytes, UA: NEGATIVE
Nitrite: NEGATIVE
Protein, ur: NEGATIVE mg/dL
Specific Gravity, Urine: 1.023 (ref 1.005–1.030)
Urobilinogen, UA: 1 mg/dL (ref 0.0–1.0)
pH: 6.5 (ref 5.0–8.0)

## 2013-02-02 LAB — WET PREP, GENITAL
Clue Cells Wet Prep HPF POC: NONE SEEN
Trich, Wet Prep: NONE SEEN
WBC, Wet Prep HPF POC: NONE SEEN
Yeast Wet Prep HPF POC: NONE SEEN

## 2013-02-02 LAB — PREGNANCY, URINE: Preg Test, Ur: NEGATIVE

## 2013-02-02 LAB — CBC
HCT: 34.9 % — ABNORMAL LOW (ref 36.0–46.0)
Hemoglobin: 11.8 g/dL — ABNORMAL LOW (ref 12.0–15.0)
MCH: 29.8 pg (ref 26.0–34.0)
MCHC: 33.8 g/dL (ref 30.0–36.0)
MCV: 88.1 fL (ref 78.0–100.0)
Platelets: 303 10*3/uL (ref 150–400)
RBC: 3.96 MIL/uL (ref 3.87–5.11)
RDW: 15.4 % (ref 11.5–15.5)
WBC: 8.3 10*3/uL (ref 4.0–10.5)

## 2013-02-02 LAB — POCT I-STAT, CHEM 8
Calcium, Ion: 1.17 mmol/L (ref 1.12–1.23)
Chloride: 106 mEq/L (ref 96–112)
HCT: 38 % (ref 36.0–46.0)
Sodium: 139 mEq/L (ref 135–145)

## 2013-02-02 LAB — URINE MICROSCOPIC-ADD ON

## 2013-02-02 MED ORDER — MORPHINE SULFATE 4 MG/ML IJ SOLN
6.0000 mg | Freq: Once | INTRAMUSCULAR | Status: AC
  Administered 2013-02-02: 6 mg via INTRAMUSCULAR
  Filled 2013-02-02: qty 2

## 2013-02-02 MED ORDER — MEDROXYPROGESTERONE ACETATE 5 MG PO TABS: 10.0000 mg | ORAL_TABLET | Freq: Every day | ORAL | Status: DC

## 2013-02-02 MED ORDER — ONDANSETRON 4 MG PO TBDP
4.0000 mg | ORAL_TABLET | Freq: Once | ORAL | Status: AC
  Administered 2013-02-02: 4 mg via ORAL
  Filled 2013-02-02: qty 1

## 2013-02-02 MED ORDER — IBUPROFEN 800 MG PO TABS: 800.0000 mg | ORAL_TABLET | Freq: Three times a day (TID) | ORAL | Status: DC

## 2013-02-02 MED ORDER — HYDROCODONE-ACETAMINOPHEN 5-325 MG PO TABS: ORAL_TABLET | ORAL | Status: DC

## 2013-02-02 NOTE — ED Notes (Signed)
Pt reports heavy bleeding and cramping that started on Sunday. Reports hx of endometriosis but no recent problems. Denies any n/v or urinary symptoms with the bleeding.

## 2013-02-02 NOTE — ED Provider Notes (Signed)
History    CSN: [id number] Arrival date & time 02/02/13  0804  First MD Initiated Contact with Patient 02/02/13 0820     Chief Complaint  Patient presents with  . Abdominal Cramping  . Vaginal Bleeding   (Consider location/radiation/quality/duration/timing/severity/associated sxs/prior Treatment) HPI Pt is a 32yo female with hx of endometriosis presenting with lower abdominal pain and cramping that is constant, 10/10 associated with significant vaginal bleeding, having to change tampons every 2 hours as well as use a feminine pad.  States she has had similar symptoms in past that last 10-14 days however this episode seems worse.  Pt also reports some nausea.  Denies fever or urinary symptoms.    Past Medical History  Diagnosis Date  . Hypertension   . Murmur, heart   . Heart valve regurgitation    Past Surgical History  Procedure Laterality Date  . Cesarean section    . Cholecystectomy     History reviewed. No pertinent family history. History  Substance Use Topics  . Smoking status: Current Every Day Smoker -- 0.50 packs/day  . Smokeless tobacco: Not on file  . Alcohol Use: No   OB History   Grav Para Term Preterm Abortions TAB SAB Ect Mult Living                 Review of Systems  Constitutional: Positive for fatigue ( mild). Negative for fever and chills.  Respiratory: Negative for shortness of breath.   Cardiovascular: Negative for chest pain.  Gastrointestinal: Positive for nausea and abdominal pain ( lower). Negative for vomiting and diarrhea.  Genitourinary: Positive for vaginal bleeding and pelvic pain. Negative for dysuria and flank pain.  All other systems reviewed and are negative.    Allergies  Acetaminophen  Home Medications   Current Outpatient Rx  Name  Route  Sig  Dispense  Refill  . aspirin-acetaminophen-caffeine (EXCEDRIN MIGRAINE) 250-250-65 MG per tablet   Oral   Take 1 tablet by mouth every 6 (six) hours as needed. For pain          . ibuprofen (ADVIL,MOTRIN) 800 MG tablet   Oral   Take 800 mg by mouth every 8 (eight) hours as needed. For pain         . HYDROcodone-acetaminophen (NORCO/VICODIN) 5-325 MG per tablet      Take 1-2 pills every 4-6 hours as needed for pain.   6 tablet   0   . ibuprofen (ADVIL,MOTRIN) 800 MG tablet   Oral   Take 1 tablet (800 mg total) by mouth 3 (three) times daily.   21 tablet   0   . medroxyPROGESTERone (PROVERA) 5 MG tablet   Oral   Take 2 tablets (10 mg total) by mouth daily.   5 tablet   0    BP 154/60  Pulse 81  Temp(Src) 98.9 F (37.2 C) (Oral)  Resp 16  Ht 4\' 9"  (1.448 m)  Wt 100 lb (45.36 kg)  BMI 21.63 kg/m2  SpO2 100% Physical Exam  Nursing note and vitals reviewed. Constitutional: She appears well-developed and well-nourished.  Pt lying in darkened room, does not look like she feels well but NAD  HENT:  Head: Normocephalic and atraumatic.  Eyes: Conjunctivae are normal. No scleral icterus.  Neck: Normal range of motion.  Cardiovascular: Normal rate, regular rhythm and normal heart sounds.   Pulmonary/Chest: Effort normal and breath sounds normal. No respiratory distress. She has no wheezes. She has no rales. She exhibits no tenderness.  Abdominal: Soft. Bowel sounds are normal. She exhibits no distension and no mass. There is tenderness ( lower abd). There is no rebound and no guarding.  Genitourinary: No labial fusion. There is no rash, tenderness, lesion or injury on the right labia. There is no rash, tenderness, lesion or injury on the left labia. Cervix exhibits motion tenderness and discharge ( blood). Cervix exhibits no friability. Right adnexum displays tenderness. Right adnexum displays no mass and no fullness. Left adnexum displays tenderness. Left adnexum displays no mass and no fullness. There is bleeding around the vagina. No erythema or tenderness around the vagina. No foreign body around the vagina. No signs of injury around the vagina. No  vaginal discharge found.  Musculoskeletal: Normal range of motion.  Neurological: She is alert.  Skin: Skin is warm and dry.    ED Course  Procedures (including critical care time) Labs Reviewed  CBC - Abnormal; Notable for the following:    Hemoglobin 11.8 (*)    HCT 34.9 (*)    All other components within normal limits  URINALYSIS, ROUTINE W REFLEX MICROSCOPIC - Abnormal; Notable for the following:    Color, Urine AMBER (*)    APPearance CLOUDY (*)    Hgb urine dipstick LARGE (*)    Bilirubin Urine SMALL (*)    All other components within normal limits  URINE MICROSCOPIC-ADD ON - Abnormal; Notable for the following:    Squamous Epithelial / LPF FEW (*)    Bacteria, UA FEW (*)    All other components within normal limits  WET PREP, GENITAL  GC/CHLAMYDIA PROBE AMP  PREGNANCY, URINE  POCT I-STAT, CHEM 8   No results found. 1. Abdominal cramping   2. Vaginal bleeding     MDM  Symptoms likely due to endometriosis, will ensure pt is not becoming severely anemic due to significant vaginal bleeding.    CBC: consistent with pt's previous labs Chem 8: unremarkable Wet prep: unremarkable UA: unremarkable Urine preg: neg  Discussed pt with Dr. Ignacia Palma who suggested Rx: Provera.  Will have pt f/u with GYN  Rx: ibuprofen and norco. Will discharge pt home and have her f/u with Dr. Mayford Knife and her GYN for further tx as needed. Return precautions given. Pt verbalized understanding and agreement with tx plan. Vitals: unremarkable. Discharged in stable condition.    Discussed pt with attending during ED encounter.   Junius Finner, PA-C 02/02/13 1110

## 2013-02-03 LAB — GC/CHLAMYDIA PROBE AMP
CT Probe RNA: NEGATIVE
GC Probe RNA: NEGATIVE

## 2013-02-03 NOTE — ED Provider Notes (Signed)
Medical screening examination/treatment/procedure(s) were performed by non-physician practitioner and as supervising physician I was immediately available for consultation/collaboration.   Carleene Cooper III, MD 02/03/13 1031

## 2013-02-12 ENCOUNTER — Emergency Department (HOSPITAL_COMMUNITY)
Admission: EM | Admit: 2013-02-12 | Discharge: 2013-02-12 | Disposition: A | Discharge status: Home / Self Care | Payer: Self-pay | Attending: Emergency Medicine | Admitting: Emergency Medicine

## 2013-02-12 ENCOUNTER — Encounter (HOSPITAL_COMMUNITY): Payer: Self-pay | Admitting: Emergency Medicine

## 2013-02-12 DIAGNOSIS — F17200 Nicotine dependence, unspecified, uncomplicated: Secondary | ICD-10-CM | POA: Insufficient documentation

## 2013-02-12 DIAGNOSIS — N938 Other specified abnormal uterine and vaginal bleeding: Secondary | ICD-10-CM | POA: Insufficient documentation

## 2013-02-12 DIAGNOSIS — Z9089 Acquired absence of other organs: Secondary | ICD-10-CM | POA: Insufficient documentation

## 2013-02-12 DIAGNOSIS — Z8679 Personal history of other diseases of the circulatory system: Secondary | ICD-10-CM | POA: Insufficient documentation

## 2013-02-12 DIAGNOSIS — I10 Essential (primary) hypertension: Secondary | ICD-10-CM | POA: Insufficient documentation

## 2013-02-12 DIAGNOSIS — N949 Unspecified condition associated with female genital organs and menstrual cycle: Secondary | ICD-10-CM | POA: Insufficient documentation

## 2013-02-12 DIAGNOSIS — R011 Cardiac murmur, unspecified: Secondary | ICD-10-CM | POA: Insufficient documentation

## 2013-02-12 DIAGNOSIS — Z3202 Encounter for pregnancy test, result negative: Secondary | ICD-10-CM | POA: Insufficient documentation

## 2013-02-12 LAB — PREGNANCY, URINE: Preg Test, Ur: NEGATIVE

## 2013-02-12 LAB — HEMOGLOBIN AND HEMATOCRIT, BLOOD: Hemoglobin: 11.2 g/dL — ABNORMAL LOW (ref 12.0–15.0)

## 2013-02-12 MED ORDER — OXYCODONE HCL 5 MG PO TABS: 5.0000 mg | ORAL_TABLET | ORAL | Status: DC | PRN

## 2013-02-12 NOTE — ED Provider Notes (Signed)
CSN: [id number]     Arrival date & time 02/12/13  4098 History     First MD Initiated Contact with Patient 02/12/13 [phone]     Chief Complaint  Patient presents with  . Abdominal Pain   (Consider location/radiation/quality/duration/timing/severity/associated sxs/prior Treatment) Patient is a 32 y.o. female presenting with abdominal pain. The history is provided by the patient.  Abdominal Pain Associated symptoms include abdominal pain. Pertinent negatives include no chills or fever. Associated symptoms comments: She reports continued vaginal bleeding that caused evaluation in the emergency department on 02/02/13. She states she had a normal period starting on 01/27/13 that became heavier than her usual prompting evaluation. She is having lower abdominal/pelvic cramping. No vaginal discharge. She was placed on Provera at that time which helped, but bleeding recurred 2 days ago with clots. She states that she has noticed her periods have been getting progressively worse over some time but they have remained regular. No fever, dysuria. .    Past Medical History  Diagnosis Date  . Hypertension   . Murmur, heart   . Heart valve regurgitation    Past Surgical History  Procedure Laterality Date  . Cesarean section    . Cholecystectomy     No family history on file. History  Substance Use Topics  . Smoking status: Current Every Day Smoker -- 0.50 packs/day  . Smokeless tobacco: Not on file  . Alcohol Use: No   OB History   Grav Para Term Preterm Abortions TAB SAB Ect Mult Living                 Review of Systems  Constitutional: Negative for fever and chills.  HENT: Negative.   Respiratory: Negative.   Cardiovascular: Negative.   Gastrointestinal: Positive for abdominal pain.  Genitourinary: Positive for vaginal bleeding and pelvic pain. Negative for dysuria and vaginal discharge.  Musculoskeletal: Negative.   Skin: Negative.   Neurological: Negative.     Allergies   Acetaminophen  Home Medications   Current Outpatient Rx  Name  Route  Sig  Dispense  Refill  . ibuprofen (ADVIL,MOTRIN) 800 MG tablet   Oral   Take 1 tablet (800 mg total) by mouth 3 (three) times daily.   21 tablet   0   . aspirin-acetaminophen-caffeine (EXCEDRIN MIGRAINE) 250-250-65 MG per tablet   Oral   Take 1 tablet by mouth every 6 (six) hours as needed. For pain          BP 127/89  Pulse 105  Temp(Src) 97.9 F (36.6 C) (Oral)  Resp 20  SpO2 98% Physical Exam  Constitutional: She is oriented to person, place, and time. She appears well-developed and well-nourished.  HENT:  Head: Normocephalic.  Neck: Normal range of motion. Neck supple.  Pulmonary/Chest: Effort normal.  Abdominal: Soft. There is no tenderness. There is no rebound and no guarding.  Genitourinary:  Bimanual pelvic exam without finding of palpable ovarian enlargement or mass. Non-tender adnexa. No palpable uterine mass c/w fibroid.   Musculoskeletal: Normal range of motion.  Neurological: She is alert and oriented to person, place, and time.  Skin: Skin is warm and dry. No rash noted.  Psychiatric: She has a normal mood and affect.    ED Course   Procedures (including critical care time)  Labs Reviewed  HEMOGLOBIN AND HEMATOCRIT, BLOOD - Abnormal; Notable for the following:    Hemoglobin 11.2 (*)    HCT 33.5 (*)    All other components within normal limits  PREGNANCY, URINE  No results found. No diagnosis found. 1. Dysfunctional uterine bleeding MDM  Speculum pelvic exam was deferred because of recent exam and negative tests at that time. Pregnancy still negative, hemoglobin stable. Patient can be discharged and referred to [facility].  Arnoldo Hooker, PA-C 02/12/13 1131

## 2013-02-12 NOTE — ED Notes (Signed)
Pt made aware of need of urine specimen; pt up ambulatory to the bathroom at this time to attempt to provide an urine specimen

## 2013-02-12 NOTE — ED Notes (Signed)
PT ambulated with baseline gait; VSS; A&Ox3; no signs of distress; respirations even and unlabored; skin warm and dry; no questions upon discharge.  

## 2013-02-12 NOTE — ED Notes (Signed)
abd pain  Cramping  And vag bleeding  Started again yesterday  Was seen on the 7/18 for same

## 2013-02-12 NOTE — ED Provider Notes (Signed)
Medical screening examination/treatment/procedure(s) were performed by non-physician practitioner and as supervising physician I was immediately available for consultation/collaboration.   Glynn Octave, MD 02/12/13 [phone]

## 2013-03-22 ENCOUNTER — Ambulatory Visit: Payer: Self-pay | Admitting: Obstetrics & Gynecology

## 2013-04-24 ENCOUNTER — Emergency Department (HOSPITAL_COMMUNITY)
Admission: EM | Admit: 2013-04-24 | Discharge: 2013-04-24 | Disposition: A | Discharge status: Home / Self Care | Payer: Self-pay | Attending: Emergency Medicine | Admitting: Emergency Medicine

## 2013-04-24 ENCOUNTER — Encounter (HOSPITAL_COMMUNITY): Payer: Self-pay | Admitting: Emergency Medicine

## 2013-04-24 DIAGNOSIS — R011 Cardiac murmur, unspecified: Secondary | ICD-10-CM | POA: Insufficient documentation

## 2013-04-24 DIAGNOSIS — K089 Disorder of teeth and supporting structures, unspecified: Secondary | ICD-10-CM | POA: Insufficient documentation

## 2013-04-24 DIAGNOSIS — F17200 Nicotine dependence, unspecified, uncomplicated: Secondary | ICD-10-CM | POA: Insufficient documentation

## 2013-04-24 DIAGNOSIS — K0889 Other specified disorders of teeth and supporting structures: Secondary | ICD-10-CM

## 2013-04-24 DIAGNOSIS — H9209 Otalgia, unspecified ear: Secondary | ICD-10-CM | POA: Insufficient documentation

## 2013-04-24 DIAGNOSIS — K029 Dental caries, unspecified: Secondary | ICD-10-CM | POA: Insufficient documentation

## 2013-04-24 DIAGNOSIS — I10 Essential (primary) hypertension: Secondary | ICD-10-CM | POA: Insufficient documentation

## 2013-04-24 MED ORDER — NAPROXEN 500 MG PO TABS: 500.0000 mg | ORAL_TABLET | Freq: Two times a day (BID) | ORAL | Status: DC

## 2013-04-24 MED ORDER — OXYCODONE HCL 5 MG PO TABS: 5.0000 mg | ORAL_TABLET | ORAL | Status: DC | PRN

## 2013-04-24 MED ORDER — PENICILLIN V POTASSIUM 500 MG PO TABS: 500.0000 mg | ORAL_TABLET | Freq: Three times a day (TID) | ORAL | Status: DC

## 2013-04-24 NOTE — Progress Notes (Signed)
[facility] did not get to see patient but will be sending information about the [facility] Orange Card program, using the address provided.  °

## 2013-04-24 NOTE — ED Provider Notes (Signed)
Medical screening examination/treatment/procedure(s) were performed by non-physician practitioner and as supervising physician I was immediately available for consultation/collaboration.  Courtney F Horton, MD 04/24/13 1754 

## 2013-04-24 NOTE — ED Notes (Signed)
Pt reports l/ear and l/dental pain x 4 days

## 2013-04-24 NOTE — ED Provider Notes (Signed)
CSN: [id number]     Arrival date & time 04/24/13  1250 History  This chart was scribed for non-physician practitioner working with Shon Baton, MD by Ashley Jacobs, ED scribe. This patient was seen in room WTR5/WTR5 and the patient's care was started at 1:02 PM   First MD Initiated Contact with Patient 04/24/13 1255     Chief Complaint  Patient presents with  . Dental Pain    l/lower side of mouth  . Otalgia    l/ear pain x 4 days   (Consider location/radiation/quality/duration/timing/severity/associated sxs/prior Treatment) The history is provided by the patient and medical records. No language interpreter was used.   HPI Comments: Jamie Pratt is a 32 y.o. female who presents to the Emergency Department complaining of left lower dental pain and facial swelling. She denies trouble swallowing. Pain started last week. She had teeth extraction for multiple teeth previously but she is unable to have any more extracted right now. Pt denies nausea, vomiting and fever. No trouble swallowing. Pain radiates to R ear.  Past Medical History  Diagnosis Date  . Hypertension   . Murmur, heart   . Heart valve regurgitation    Past Surgical History  Procedure Laterality Date  . Cesarean section    . Cholecystectomy     No family history on file. History  Substance Use Topics  . Smoking status: Current Every Day Smoker -- 0.50 packs/day  . Smokeless tobacco: Not on file  . Alcohol Use: No   OB History   Grav Para Term Preterm Abortions TAB SAB Ect Mult Living                 Review of Systems  Constitutional: Negative for fever.  HENT: Positive for ear pain and dental problem. Negative for sore throat, facial swelling, trouble swallowing and neck pain.   Respiratory: Negative for shortness of breath and stridor.   Gastrointestinal: Negative for nausea and vomiting.  Skin: Negative for color change.  Neurological: Negative for headaches.    Allergies   Acetaminophen  Home Medications   Current Outpatient Rx  Name  Route  Sig  Dispense  Refill  . aspirin-acetaminophen-caffeine (EXCEDRIN MIGRAINE) 250-250-65 MG per tablet   Oral   Take 1 tablet by mouth every 6 (six) hours as needed. For pain         . ibuprofen (ADVIL,MOTRIN) 200 MG tablet   Oral   Take 400 mg by mouth every 6 (six) hours as needed for pain.          BP 121/85  Pulse 92  Temp(Src) 98.2 F (36.8 C) (Oral)  Resp 16  Wt 98 lb (44.453 kg)  BMI 21.2 kg/m2  SpO2 97%  LMP 04/20/2013 Physical Exam  Nursing note and vitals reviewed. Constitutional: She appears well-developed and well-nourished.  HENT:  Head: Normocephalic and atraumatic.  Right Ear: Tympanic membrane, external ear and ear canal normal.  Left Ear: Tympanic membrane, external ear and ear canal normal.  Nose: Nose normal.  Mouth/Throat: Uvula is midline, oropharynx is clear and moist and mucous membranes are normal. No trismus in the jaw. Abnormal dentition. Dental caries present. No dental abscesses or edematous. No tonsillar abscesses.  Advanced periodontal disease. Patient with broken premolars and molars of left mandibular jaw. There is some erythema and fullness of the gums posteriorly. No palpable abscess.  Eyes: Conjunctivae are normal.  Neck: Normal range of motion. Neck supple.  No neck swelling or Ludwig's angina  Lymphadenopathy:  She has no cervical adenopathy.  Neurological: She is alert.  Skin: Skin is warm and dry.  Psychiatric: She has a normal mood and affect.    ED Course  Procedures (including critical care time) Labs Review Labs Reviewed - No data to display Imaging Review No results found.  1:18 PM Patient seen and examined.   Vital signs reviewed and are as follows: Filed Vitals:   04/24/13 1308  BP: 121/85  Pulse: 92  Temp: 98.2 F (36.8 C)  Resp: 16   Patient counseled to take prescribed medications as directed, return with worsening facial or neck  swelling, and to follow-up with their dentist as soon as possible.   Patient counseled on use of narcotic pain medications. Counseled not to combine these medications with others containing tylenol. Urged not to drink alcohol, drive, or perform any other activities that requires focus while taking these medications. The patient verbalizes understanding and agrees with the plan.   Referrals given.    MDM   1. Pain, dental    Patient with toothache.  No gross abscess.  Exam unconcerning for Ludwig's angina or other deep tissue infection in neck.  Will treat with penicillin and pain medicine.  Urged patient to follow-up with dentist.       Renne Crigler, PA-C 04/24/13 1321

## 2013-05-04 ENCOUNTER — Ambulatory Visit: Payer: Self-pay | Admitting: Obstetrics & Gynecology

## 2013-06-28 ENCOUNTER — Emergency Department (HOSPITAL_COMMUNITY)
Admission: EM | Admit: 2013-06-28 | Discharge: 2013-06-28 | Disposition: A | Discharge status: Home / Self Care | Payer: No Typology Code available for payment source | Attending: Emergency Medicine | Admitting: Emergency Medicine

## 2013-06-28 ENCOUNTER — Encounter (HOSPITAL_COMMUNITY): Payer: Self-pay | Admitting: Emergency Medicine

## 2013-06-28 DIAGNOSIS — R011 Cardiac murmur, unspecified: Secondary | ICD-10-CM | POA: Insufficient documentation

## 2013-06-28 DIAGNOSIS — I10 Essential (primary) hypertension: Secondary | ICD-10-CM | POA: Insufficient documentation

## 2013-06-28 DIAGNOSIS — IMO0001 Reserved for inherently not codable concepts without codable children: Secondary | ICD-10-CM | POA: Insufficient documentation

## 2013-06-28 DIAGNOSIS — R52 Pain, unspecified: Secondary | ICD-10-CM | POA: Insufficient documentation

## 2013-06-28 DIAGNOSIS — F17200 Nicotine dependence, unspecified, uncomplicated: Secondary | ICD-10-CM | POA: Insufficient documentation

## 2013-06-28 DIAGNOSIS — M79601 Pain in right arm: Secondary | ICD-10-CM

## 2013-06-28 DIAGNOSIS — M549 Dorsalgia, unspecified: Secondary | ICD-10-CM | POA: Insufficient documentation

## 2013-06-28 NOTE — ED Notes (Signed)
Pt reports r/upper arm pain with decreased ROM x 8 hrs. Denies trauma. Denies numbness or tingling in r/hand

## 2013-06-28 NOTE — ED Provider Notes (Signed)
CSN: [id number]     Arrival date & time 06/28/13  1617 History  This chart was scribed for non-physician practitioner Junious Silk, PA-C, working with Audree Camel, MD, by Yevette Edwards, ED Scribe. This patient was seen in room WTR7/WTR7 and the patient's care was started at 5:39 PM.   First MD Initiated Contact with Patient 06/28/13 1723     Chief Complaint  Patient presents with  . Arm Pain    0730 c/o r/upper/arm pain. Denies trauma    The history is provided by the patient. No language interpreter was used.   HPI Comments: Jamie Pratt is a 32 y.o. female who presents to the Emergency Department complaining of acute right upper-arm pain which has been associated with decreased ROM and the onset of which was 8 hours ago. She reports the pain radiates down to her elbow and down her scapula. The pt states the pain is increased with palpation and the movement of lifting her arm. She has treated the pain with 800 mg IBU without relief. She denies any recent trauma or any unusual activities which may have caused injury.  She denies any h/o problems with her right shoulder. She denies any fever, chills, SOB, chest pain,or a rash. She has a h/o HTN and a heart murmur. The pt is a current smoker.    Past Medical History  Diagnosis Date  . Hypertension   . Murmur, heart   . Heart valve regurgitation    Past Surgical History  Procedure Laterality Date  . Cesarean section    . Cholecystectomy    . Mouth surgery    . Tubal ligation     Family History  Problem Relation Age of Onset  . Hypertension Mother   . Diabetes Mother   . Hypertension Father   . Diabetes Father    History  Substance Use Topics  . Smoking status: Current Every Day Smoker -- 0.50 packs/day    Types: Cigarettes  . Smokeless tobacco: Not on file  . Alcohol Use: No   No OB history provided.  Review of Systems  Constitutional: Negative for fever and chills.  Respiratory: Negative for shortness of  breath.   Cardiovascular: Negative for chest pain.  Musculoskeletal: Positive for myalgias.  Skin: Negative for rash.  All other systems reviewed and are negative.   Allergies  Acetaminophen  Home Medications   Current Outpatient Rx  Name  Route  Sig  Dispense  Refill  . ibuprofen (ADVIL,MOTRIN) 800 MG tablet   Oral   Take 800 mg by mouth every 8 (eight) hours as needed.          Triage Vitals: BP 143/93  Pulse 106  Temp(Src) 98.5 F (36.9 C) (Oral)  Resp 20  SpO2 100%  LMP 06/22/2013  Physical Exam  Nursing note and vitals reviewed. Constitutional: She is oriented to person, place, and time. She appears well-developed and well-nourished. No distress.  HENT:  Head: Normocephalic and atraumatic.  Eyes: EOM are normal.  Neck: Neck supple. No tracheal deviation present.  Cardiovascular: Normal rate.   No carotid bruit or thrill  Pulmonary/Chest: Effort normal. No respiratory distress.  Musculoskeletal: Normal range of motion. She exhibits tenderness.  Tender to palpation diffusely from shouler to elbow. ROM limited in shoulder. No tenderness to palpation over cervical or thoracic spine.  Grip strength 5/5. Compartment soft.   Neurological: She is alert and oriented to person, place, and time.  Neurovascularly intact.   Skin: Skin is warm  and dry.  Psychiatric: She has a normal mood and affect. Her behavior is normal.    ED Course  Procedures (including critical care time)  DIAGNOSTIC STUDIES: Oxygen Saturation is 100% on room air, normal by my interpretation.    COORDINATION OF CARE:  5:44 PM - Discussed treatment plan with patient which includes rest and icing the site, and the patient agreed to the plan. Also advised pt to continue using IBU. Informed pt that she could also use tylenol, but the pt states that tylenol causes her abdominal pain.   Labs Review Labs Reviewed - No data to display Imaging Review No results found.  EKG Interpretation   None        MDM   1. Right arm pain   2. Musculoskeletal arm pain, right    Patient presents with right arm pain for the past 8 hours. No known injury, but worse after awakening this morning. Very tender to palpation and with movement. This pain is likely MSK in nature. Continue IBU and rest. Ice may be helpful. Compartment is soft, neurovascularly intact. No rash, erythema, bruising. Pt is afebrile and well appearing. No associated SOB or CP. Return instructions given. Vital signs stable for discharge. Patient / Family / Caregiver informed of clinical course, understand medical decision-making process, and agree with plan.   I personally performed the services described in this documentation, which was scribed in my presence. The recorded information has been reviewed and is accurate.     Mora Bellman, PA-C 07/01/13 1204

## 2013-07-02 NOTE — ED Provider Notes (Signed)
Medical screening examination/treatment/procedure(s) were performed by non-physician practitioner and as supervising physician I was immediately available for consultation/collaboration.  EKG Interpretation   None         Jamie Leaf T Minh Roanhorse, MD 07/02/13 1325 

## 2013-08-17 ENCOUNTER — Emergency Department (HOSPITAL_COMMUNITY)
Admission: EM | Admit: 2013-08-17 | Discharge: 2013-08-17 | Disposition: A | Discharge status: Home / Self Care | Payer: No Typology Code available for payment source | Attending: Emergency Medicine | Admitting: Emergency Medicine

## 2013-08-17 ENCOUNTER — Encounter (HOSPITAL_COMMUNITY): Payer: Self-pay | Admitting: Emergency Medicine

## 2013-08-17 DIAGNOSIS — K089 Disorder of teeth and supporting structures, unspecified: Secondary | ICD-10-CM | POA: Insufficient documentation

## 2013-08-17 DIAGNOSIS — K0889 Other specified disorders of teeth and supporting structures: Secondary | ICD-10-CM

## 2013-08-17 DIAGNOSIS — R51 Headache: Secondary | ICD-10-CM | POA: Insufficient documentation

## 2013-08-17 DIAGNOSIS — R011 Cardiac murmur, unspecified: Secondary | ICD-10-CM | POA: Insufficient documentation

## 2013-08-17 DIAGNOSIS — I10 Essential (primary) hypertension: Secondary | ICD-10-CM | POA: Insufficient documentation

## 2013-08-17 DIAGNOSIS — K029 Dental caries, unspecified: Secondary | ICD-10-CM | POA: Insufficient documentation

## 2013-08-17 DIAGNOSIS — R220 Localized swelling, mass and lump, head: Secondary | ICD-10-CM | POA: Insufficient documentation

## 2013-08-17 DIAGNOSIS — F17200 Nicotine dependence, unspecified, uncomplicated: Secondary | ICD-10-CM | POA: Insufficient documentation

## 2013-08-17 DIAGNOSIS — R221 Localized swelling, mass and lump, neck: Secondary | ICD-10-CM

## 2013-08-17 MED ORDER — HYDROCODONE-IBUPROFEN 7.5-200 MG PO TABS: 1.0000 | ORAL_TABLET | Freq: Four times a day (QID) | ORAL | Status: DC | PRN

## 2013-08-17 MED ORDER — PENICILLIN V POTASSIUM 500 MG PO TABS: 500.0000 mg | ORAL_TABLET | Freq: Four times a day (QID) | ORAL | Status: AC

## 2013-08-17 NOTE — ED Provider Notes (Signed)
CSN: [id number]     Arrival date & time 08/17/13  1755 History   First MD Initiated Contact with Patient 08/17/13 1830     Chief Complaint  Patient presents with  . headache and  toothache    (Consider location/radiation/quality/duration/timing/severity/associated sxs/prior Treatment) Patient is a 33 y.o. female presenting with tooth pain. The history is provided by the patient. No language interpreter was used.  Dental Pain Location:  Lower Lower teeth location:  30/RL 1st molar and 19/LL 1st molar Quality:  Sharp Severity:  Moderate Onset quality:  Gradual Timing:  Constant Progression:  Worsening Chronicity:  New Relieved by:  Nothing Worsened by:  Nothing tried Ineffective treatments:  None tried Associated symptoms: facial swelling   Pt has a toothache.  Pt reports swelling on both sides of her mouth.  Pt has a heart murmur  Past Medical History  Diagnosis Date  . Hypertension   . Murmur, heart   . Heart valve regurgitation    Past Surgical History  Procedure Laterality Date  . Cesarean section    . Cholecystectomy    . Mouth surgery    . Tubal ligation     Family History  Problem Relation Age of Onset  . Hypertension Mother   . Diabetes Mother   . Hypertension Father   . Diabetes Father    History  Substance Use Topics  . Smoking status: Current Every Day Smoker -- 0.50 packs/day    Types: Cigarettes  . Smokeless tobacco: Not on file  . Alcohol Use: No   OB History   Grav Para Term Preterm Abortions TAB SAB Ect Mult Living                 Review of Systems  HENT: Positive for dental problem and facial swelling.   All other systems reviewed and are negative.    Allergies  Acetaminophen  Home Medications   Current Outpatient Rx  Name  Route  Sig  Dispense  Refill  . ibuprofen (ADVIL,MOTRIN) 800 MG tablet   Oral   Take 800 mg by mouth every 8 (eight) hours as needed for moderate pain.           BP 175/97  Pulse 95  Temp(Src) 98.1 F  (36.7 C) (Oral)  Resp 18  Wt 100 lb 12.8 oz (45.723 kg)  SpO2 100%  LMP 06/18/2013 Physical Exam  Nursing note and vitals reviewed. Constitutional: She is oriented to person, place, and time. She appears well-developed and well-nourished.  HENT:  Head: Normocephalic.  Severe dental decay  Eyes: Pupils are equal, round, and reactive to light.  Pulmonary/Chest: Effort normal.  Abdominal: Soft.  Musculoskeletal: Normal range of motion.  Neurological: She is alert and oriented to person, place, and time. She has normal reflexes.  Skin: Skin is warm.  Psychiatric: She has a normal mood and affect.    ED Course  Procedures (including critical care time) Labs Review Labs Reviewed - No data to display Imaging Review No results found.  EKG Interpretation   None       MDM   1. Toothache    vicoprofen and pcn.   Call Dr. Mayford Knifeurner to be seen for evaluation    Jamie AreasLeslie K Yuma Blucher, PA-C 08/17/13 1856

## 2013-08-17 NOTE — ED Notes (Signed)
The pt has had a headache  And toothache for one week.  Hx of migraine headaches

## 2013-08-17 NOTE — Discharge Instructions (Signed)

## 2013-08-18 NOTE — ED Provider Notes (Signed)
Medical screening examination/treatment/procedure(s) were performed by non-physician practitioner and as supervising physician I was immediately available for consultation/collaboration.  EKG Interpretation   None        Anagabriela Jokerst R. Jannat Rosemeyer, MD 08/18/13 2338 

## 2013-10-24 ENCOUNTER — Encounter (HOSPITAL_COMMUNITY): Payer: Self-pay | Admitting: Emergency Medicine

## 2013-10-24 ENCOUNTER — Emergency Department (HOSPITAL_COMMUNITY)
Admission: EM | Admit: 2013-10-24 | Discharge: 2013-10-24 | Disposition: A | Discharge status: Home / Self Care | Payer: No Typology Code available for payment source | Attending: Emergency Medicine | Admitting: Emergency Medicine

## 2013-10-24 DIAGNOSIS — I10 Essential (primary) hypertension: Secondary | ICD-10-CM | POA: Insufficient documentation

## 2013-10-24 DIAGNOSIS — R509 Fever, unspecified: Secondary | ICD-10-CM | POA: Insufficient documentation

## 2013-10-24 DIAGNOSIS — R011 Cardiac murmur, unspecified: Secondary | ICD-10-CM | POA: Insufficient documentation

## 2013-10-24 DIAGNOSIS — K029 Dental caries, unspecified: Secondary | ICD-10-CM | POA: Insufficient documentation

## 2013-10-24 DIAGNOSIS — F17200 Nicotine dependence, unspecified, uncomplicated: Secondary | ICD-10-CM | POA: Insufficient documentation

## 2013-10-24 MED ORDER — HYDROCODONE-IBUPROFEN 7.5-200 MG PO TABS: 1.0000 | ORAL_TABLET | Freq: Four times a day (QID) | ORAL | Status: DC | PRN

## 2013-10-24 MED ORDER — PENICILLIN V POTASSIUM 500 MG PO TABS: 500.0000 mg | ORAL_TABLET | Freq: Three times a day (TID) | ORAL | Status: DC

## 2013-10-24 NOTE — Discharge Instructions (Signed)

## 2013-10-24 NOTE — ED Provider Notes (Signed)
CSN: [id number]     Arrival date & time 10/24/13  1428 History  This chart was scribed for non-physician practitioner Fayrene HelperBowie Blessing Ozga, PA-C working with Juliet RudeNathan R. Rubin PayorPickering, MD by Danella Maiersaroline Early, ED Scribe. This patient was seen in room WTR6/WTR6 and the patient's care was started at 5:00 PM.     Chief Complaint  Patient presents with  . Dental Pain   The history is provided by the patient. No language interpreter was used.   HPI Comments: Jamie Pratt is a 33 y.o. female who presents to the Emergency Department complaining of left lower dental pain onset 4-5 months ago that worsened in the last 3 weeks. She was seen in the ER 2 months ago and prescribed penicillin and 800 ibuprofen. She finished the course and has kept taking ibuprofen with no improvement. She states she went to the referred dentist after her ER visit but the dentist would not see her. She states the pain has become severe and keeps her from sleep. Cold and hot things make the pain worse. She reports low grade fever of 99-100.    Past Medical History  Diagnosis Date  . Hypertension   . Murmur, heart   . Heart valve regurgitation    Past Surgical History  Procedure Laterality Date  . Cesarean section    . Cholecystectomy    . Mouth surgery    . Tubal ligation     Family History  Problem Relation Age of Onset  . Hypertension Mother   . Diabetes Mother   . Hypertension Father   . Diabetes Father    History  Substance Use Topics  . Smoking status: Current Every Day Smoker -- 0.50 packs/day    Types: Cigarettes  . Smokeless tobacco: Not on file  . Alcohol Use: No   OB History   Grav Para Term Preterm Abortions TAB SAB Ect Mult Living                 Review of Systems  Constitutional: Positive for fever.  HENT: Positive for dental problem.       Allergies  Acetaminophen  Home Medications   Current Outpatient Rx  Name  Route  Sig  Dispense  Refill  . HYDROcodone-ibuprofen (VICOPROFEN) 7.5-200 MG per  tablet   Oral   Take 1 tablet by mouth every 6 (six) hours as needed for moderate pain.   30 tablet   0   . ibuprofen (ADVIL,MOTRIN) 800 MG tablet   Oral   Take 800 mg by mouth every 8 (eight) hours as needed for moderate pain.           BP 140/90  Pulse 88  Temp(Src) 99 F (37.2 C) (Oral)  Resp 12  SpO2 100% Physical Exam  Nursing note and vitals reviewed. Constitutional: She is oriented to person, place, and time. She appears well-developed and well-nourished. No distress.  HENT:  Head: Normocephalic and atraumatic.  significant dental decay noted to left lower jaw including teeth 17-20 with surrounding gingival erythema and edema but no obvious abscess amenable for drainage. No trismus.  Eyes: EOM are normal.  Neck: Neck supple. No tracheal deviation present.  Cardiovascular: Normal rate.   Pulmonary/Chest: Effort normal. No respiratory distress.  Musculoskeletal: Normal range of motion.  Neurological: She is alert and oriented to person, place, and time.  Skin: Skin is warm and dry.  Psychiatric: She has a normal mood and affect. Her behavior is normal.    ED Course  Procedures (  including critical care time) Medications - No data to display  DIAGNOSTIC STUDIES: Oxygen Saturation is 100% on RA, normal by my interpretation.    COORDINATION OF CARE: 5:05 PM- Discussed treatment plan with pt which includes Veetid and Vicoprofen. Discussed with pt that antibiotics and pain medication will not fix the source of the problem and she needs to see a dentist. Refer to dentist. Will discharge home. Pt agrees to plan.    Labs Review Labs Reviewed - No data to display Imaging Review No results found.   EKG Interpretation None      MDM   Final diagnoses:  Dental caries extending into pulp    BP 150/94  Pulse 88  Temp(Src) 99 F (37.2 C) (Oral)  Resp 18  SpO2 100%  I personally performed the services described in this documentation, which was scribed in my  presence. The recorded information has been reviewed and is accurate.    Fayrene Helper, PA-C 10/25/13 1103

## 2013-10-24 NOTE — ED Notes (Signed)
Pt c/o lt sided dental pain x 1 month.

## 2013-10-26 NOTE — ED Provider Notes (Signed)
Medical screening examination/treatment/procedure(s) were performed by non-physician practitioner and as supervising physician I was immediately available for consultation/collaboration.   EKG Interpretation None        Jamey Harman T Eldar Robitaille, MD 10/26/13 0705 

## 2013-11-25 ENCOUNTER — Emergency Department (HOSPITAL_COMMUNITY)
Admission: EM | Admit: 2013-11-25 | Discharge: 2013-11-25 | Disposition: A | Discharge status: Home / Self Care | Payer: No Typology Code available for payment source | Attending: Emergency Medicine | Admitting: Emergency Medicine

## 2013-11-25 ENCOUNTER — Encounter (HOSPITAL_COMMUNITY): Payer: Self-pay | Admitting: Emergency Medicine

## 2013-11-25 DIAGNOSIS — K0889 Other specified disorders of teeth and supporting structures: Secondary | ICD-10-CM

## 2013-11-25 DIAGNOSIS — Z792 Long term (current) use of antibiotics: Secondary | ICD-10-CM | POA: Insufficient documentation

## 2013-11-25 DIAGNOSIS — I10 Essential (primary) hypertension: Secondary | ICD-10-CM | POA: Insufficient documentation

## 2013-11-25 DIAGNOSIS — R011 Cardiac murmur, unspecified: Secondary | ICD-10-CM | POA: Insufficient documentation

## 2013-11-25 DIAGNOSIS — F17200 Nicotine dependence, unspecified, uncomplicated: Secondary | ICD-10-CM | POA: Insufficient documentation

## 2013-11-25 DIAGNOSIS — K089 Disorder of teeth and supporting structures, unspecified: Secondary | ICD-10-CM | POA: Insufficient documentation

## 2013-11-25 DIAGNOSIS — Z791 Long term (current) use of non-steroidal anti-inflammatories (NSAID): Secondary | ICD-10-CM | POA: Insufficient documentation

## 2013-11-25 MED ORDER — NAPROXEN 250 MG PO TABS
500.0000 mg | ORAL_TABLET | Freq: Once | ORAL | Status: AC
  Administered 2013-11-25: 500 mg via ORAL
  Filled 2013-11-25: qty 2

## 2013-11-25 MED ORDER — PENICILLIN V POTASSIUM 500 MG PO TABS: 500.0000 mg | ORAL_TABLET | Freq: Three times a day (TID) | ORAL | Status: DC

## 2013-11-25 MED ORDER — NAPROXEN 500 MG PO TABS: 500.0000 mg | ORAL_TABLET | Freq: Two times a day (BID) | ORAL | Status: DC

## 2013-11-25 NOTE — Discharge Instructions (Signed)
Dental Pain °A tooth ache may be caused by cavities (tooth decay). Cavities expose the nerve of the tooth to air and hot or cold temperatures. It may come from an infection or abscess (also called a boil or furuncle) around your tooth. It is also often caused by dental caries (tooth decay). This causes the pain you are having. °DIAGNOSIS  °Your caregiver can diagnose this problem by exam. °TREATMENT  °· If caused by an infection, it may be treated with medications which kill germs (antibiotics) and pain medications as prescribed by your caregiver. Take medications as directed. °· Only take over-the-counter or prescription medicines for pain, discomfort, or fever as directed by your caregiver. °· Whether the tooth ache today is caused by infection or dental disease, you should see your dentist as soon as possible for further care. °SEEK MEDICAL CARE IF: °The exam and treatment you received today has been provided on an emergency basis only. This is not a substitute for complete medical or dental care. If your problem worsens or new problems (symptoms) appear, and you are unable to meet with your dentist, call or return to this location. °SEEK IMMEDIATE MEDICAL CARE IF:  °· You have a fever. °· You develop redness and swelling of your face, jaw, or neck. °· You are unable to open your mouth. °· You have severe pain uncontrolled by pain medicine. °MAKE SURE YOU:  °· Understand these instructions. °· Will watch your condition. °· Will get help right away if you are not doing well or get worse. °Document Released: 07/05/2005 Document Revised: 09/27/2011 Document Reviewed: 02/21/2008 °ExitCare® Patient Information ©2014 [employer]. °  Emergency Department Resource Guide °1) Find a Doctor and Pay Out of Pocket °Although you won't have to find out who is covered by your insurance plan, it is a good idea to ask around and get recommendations. You will then need to call the office and see if the doctor you have chosen will  accept you as a new patient and what types of options they offer for patients who are self-pay. Some doctors offer discounts or will set up payment plans for their patients who do not have insurance, but you will need to ask so you aren't surprised when you get to your appointment. ° °2) Contact Your Local Health Department °Not all health departments have doctors that can see patients for sick visits, but many do, so it is worth a call to see if yours does. If you don't know where your local health department is, you can check in your phone book. The CDC also has a tool to help you locate your state's health department, and many state websites also have listings of all of their local health departments. ° °3) Find a Walk-in Clinic °If your illness is not likely to be very severe or complicated, you may want to try a walk in clinic. These are popping up all over the country in pharmacies, drugstores, and shopping centers. They're usually staffed by nurse practitioners or physician assistants that have been trained to treat common illnesses and complaints. They're usually fairly quick and inexpensive. However, if you have serious medical issues or chronic medical problems, these are probably not your best option. ° °No Primary Care Doctor: °- Call [facility] at  832-8000 - they can help you locate a primary care doctor that  accepts your insurance, provides certain services, etc. °- Physician Referral Service- 1-800-533-3463 ° °Chronic Pain Problems: °Organization           Address  Phone   Notes  °[facility]  (336) 297-2271 Patients need to be referred by their primary care doctor.  ° °Medication Assistance: °Organization         Address  Phone   Notes  °[place] [facility] [address] °[place], [place] [address] (336) 641-8030 --Must be a resident of [place] °-- Must have NO insurance coverage whatsoever (no Medicaid/ Medicare, etc.) °-- The pt.  MUST have a primary care doctor that directs their care regularly and follows them in the community °  °[facility]  (866) 331-1348   °[employer]  (888) 892-1162   ° °Agencies that provide inexpensive medical care: °Organization         Address  Phone   Notes  °[facility]  (336) 832-8035   °[facility]    (336) 832-7272   °[facility] [address] °[place], [place] [address] (336) 832-4777   °[facility] [address], °[place] (336) 271-4999   °[facility]    (336) 373-0678   °[facility]    (336) 272-1050   °[facility] ° [address], [place] Phone:  (336) 832-4444, Fax:  (336) 832-4440 Hours of Operation:  9 am - 6 pm, M-F.  Also accepts Medicaid/Medicare and self-pay.  °[facility] ° [address], [place] Phone: (336) 832-3150, Fax: (336) 832-3151. Hours of Operation:  8:30 am - 5:30 pm, M-F.  Also accepts Medicaid and self-pay.  °[facility] [place] [address], [place] Phone: (336) 878-6027   °[facility] [address], [place], [place] (336)723-1848, Ext. 123 Mondays & Thursdays: 7-9 AM.  First 15 patients are seen on a first come, first serve basis. °  ° °Medicaid-accepting [place] Providers: ° °Organization         Address  Phone   Notes  °[facility] [address], [place] (336) 641-2100 Also accepts self-pay patients.  °[facility] [address], [place] ° (336) 856-9996   °[facility] [address], [place] (336) 288-8857   °[facility] [address] [place] [address], [place] (336) 299-7000   °Veita Bland [address], [place]  ° (336) 373-1557 Only accepts Dent Access Medicaid patients after they have their name applied to their card.  ° °Self-Pay (no insurance) in  [place]: ° °Organization         Address  Phone   Notes  °Sickle Cell Patients, [facility] [address], [place] (336) 832-1970   °[facility] [address], [place] (336) 832-4400   °[facility] ° [address], [place] (336) 992-4800   °[facility]/Dr. Osei-Bonsu ° [address] [place] [address], [place] or [address], [place] (336) 841-8500 Phone number for both [place] and [place] locations is the same.  °[facility] [address], [place] (336) 299-0000   °[facility] [address] [place] [address], [place] or [address] (336) 852-7530 °(336) 878-2260   °[facility] [address], [place] (336) 350-1642, phone; (336) 294-5005, fax Sees patients 1st and 3rd Saturday of every month.  Must not qualify   for public or private insurance (i.e. Medicaid, Medicare, [place] Health Choice, Veterans' Benefits) • Household income should be no more than 200% of the poverty level •The clinic cannot treat you if you are pregnant or think you are pregnant • Sexually transmitted diseases are not treated at the clinic.  ° °Dental Care: °Organization         Address  Phone  Notes  °[facility] [address], [place] (336) 641-6152 Accepts children up to age 21 who are enrolled in Medicaid or [place] Health Choice; pregnant women with a Medicaid card; and children who have applied for Medicaid or [place] Health Choice, but were declined, whose parents can pay a reduced fee at time of service.  °[facility] [place]  [address], [place] (336) 641-7733 Accepts children up to age 21 who are enrolled in Medicaid or [place] Health Choice; pregnant women with a Medicaid card; and children who have applied for Medicaid or [place] Health Choice, but were declined, whose parents  can pay a reduced fee at time of service.  °[facility] ° [address], [place] (336) 641-4533 Patients are seen by appointment only. Walk-ins are not accepted. [facility] will see patients 18 years of age and older. °Monday - Tuesday (8am-5pm) °Most Wednesdays (8:30-5pm) °$30 per visit, cash only  °[facility] ° [address], [place] (336) 641-4533 Patients are seen by appointment only. Walk-ins are not accepted. [facility] will see patients 18 years of age and older. °One Wednesday Evening (Monthly: Volunteer Based).  $30 per visit, cash only  °[facility]  (919) 537-3737 for adults; Children under age 4, call Graduate Pediatric Dentistry at (919) [phone]. Children aged 4-14, please call (919) [phone] to request a pediatric application. ° Dental services are provided in all areas of dental care including fillings, crowns and bridges, complete and partial dentures, implants, gum treatment, root canals, and extractions. Preventive care is also provided. Treatment is provided to both adults and children. °Patients are selected via a lottery and there is often a waiting list. °  °[facility] [address], °[place] ° (336) 763-8833 [email or web address] °  °[facility] [address], [place], [place] (336)723-1848, Ext. 123 Second and Fourth Thursday of each month, opens at 6:30 AM; Clinic ends at 9 AM.  Patients are seen on a first-come first-served basis, and a limited number are seen during each clinic.  ° °[facility] ° [address], [place], [place] (336) 723-7904   Eligibility Requirements °You must have lived in [place], [place], or [place] counties for at least the last three months. °  You cannot be eligible for state or federal sponsored healthcare insurance, including Veterans Administration, Medicaid, or Medicare. °  You generally cannot be eligible for healthcare  insurance through your employer.  °  How to apply: °Eligibility screenings are held every Tuesday and Wednesday afternoon from 1:00 pm until 4:00 pm. You do not need an appointment for the interview!  °[facility] [address], [place], [place] 336-631-2330   °[facility] [facility]  336-342-8273   °[facility]  336-703-3100   ° [facility]  336-570-6415   ° °Behavioral Health Resources in the Community: °Intensive Outpatient Programs °Organization         Address  Phone    Notes  °[facility] [address], [place], [place] 336-878-6098   °[facility] [address], [place], [place] 336-832-9800   °[facility] [address], [place], [place] ° 336-882-2125   °[facility] [address],  °[place], [place] 1-800-853-5163 or 336-641-4981   °Substance Abuse Resources °Organization         Address  Phone  Notes  °[facility]  336-882-2125   °[facility]  336-784-9470   °[facility]  336-285-9073   °[facility]  336-845-3988   °Residential & Outpatient Substance Abuse Program  1-800-659-3381   °Psychological Services °Organization         Address  Phone  Notes  °[facility]  336- 832-9600   °[facility]  336- 378-7881   °[facility] [address], [place] 1-800-853-5163 or 336-641-4981   ° °Mobile Crisis Teams °Organization         Address  Phone  Notes  °[facility], Mobile Crisis Care Unit  1-877-626-1772   °Assertive °[facility] ° [address] [place], [place] 336-834-9664   °Sharon DeEsch [address] °[place] 336-554-5454   ° °Self-Help/Support Groups °Organization         Address  Phone             Notes  °[facility] - variety of support groups  336- 373-1402 Call for more information  °Narcotics Anonymous (NA),  [facility] [address], °[place]  2 meetings at this location  ° °Residential Treatment Programs °Organization         Address  Phone  Notes  °[facility] [address],    °[place]  1-866-801-8205   °[facility] ° [address], [place], [place] 704-293-8524   °[facility] [address], [place] 336-845-3988 Admissions: 8am-3pm M-F  °[facility] [address]    °[place], [place] 336-841-1104   °[facility] [address], [place], [place] 336-379-7146   °[facility] [address]  °[place], [place] 336-285-9073   °[facility] Intensive Outpatient [address], [place], [place] 336-852-3033   °[facility] [address]  °[place], [place] 1-877-615-2722 or 336-784-9470   °[facility] [address] [place], [place] 336-227-7417 Accepts Medicaid  °[facility] [address]  °[place] 1-800-659-3381 Substance Abuse/Addiction Treatment  ° °[place] Behavioral Health Resources °Organization         Address  Phone  Notes  °[facility]  (888) 581-9988   °Julie Brannon, PhD [address] [place], [place]   (336) 349-5553 or (336) 951-0000   °[facility]   [address] °[place], [place] (336) 349-4454   °[facility] [address], [place], [place] (336) 342-8316 Insurance/Medicaid/sponsorship through [facility]  °[facility] [address]                                    [place], [place] (336) 342-8316 Therapy/tele-psych/case  °[facility] [address]  ° [place],  (336) 349-2233    °Dr. Arfeen  (336) 349-4544   °[facility] [place]  [employer] [place]   [facility] 1) [address], [place] °2) [address], [place] °3)  [address], [place] (336) 349-3220 °(336) 342-7768 ° °(336) 342-8140     °[place] [facility] (336) 342-1394 or (336) 342-3537 (After Hours)    ° °   °

## 2013-11-25 NOTE — ED Provider Notes (Signed)
CSN: [id number]     Arrival date & time 11/25/13  0605 History   First MD Initiated Contact with Patient 11/25/13 [phone]     Chief Complaint  Patient presents with  . Dental Pain     (Consider location/radiation/quality/duration/timing/severity/associated sxs/prior Treatment) Patient is a 33 y.o. female presenting with tooth pain. The history is provided by the patient. No language interpreter was used.  Dental Pain Location:  Upper and lower Upper teeth location:  14/LU 1st molar Lower teeth location:  19/LL 1st molar, 18/LL 2nd molar and 17/LL 3rd molar Quality:  Dull and aching Severity:  Severe Onset quality:  Unable to specify Timing:  Constant Progression:  Waxing and waning Chronicity:  Chronic Context: poor dentition   Prior workup: none. Relieved by:  Nothing Worsened by:  Hot food/drink, touching and cold food/drink Ineffective treatments: has taken partial abx rx from last visit. Associated symptoms: facial pain and gum swelling   Associated symptoms: no congestion, no difficulty swallowing, no drooling, no facial swelling, no fever, no headaches, no neck pain, no neck swelling and no trismus   Risk factors: lack of dental care     Past Medical History  Diagnosis Date  . Hypertension   . Murmur, heart   . Heart valve regurgitation    Past Surgical History  Procedure Laterality Date  . Cesarean section    . Cholecystectomy    . Mouth surgery    . Tubal ligation     Family History  Problem Relation Age of Onset  . Hypertension Mother   . Diabetes Mother   . Hypertension Father   . Diabetes Father    History  Substance Use Topics  . Smoking status: Current Every Day Smoker -- 0.50 packs/day    Types: Cigarettes  . Smokeless tobacco: Not on file  . Alcohol Use: No   OB History   Grav Para Term Preterm Abortions TAB SAB Ect Mult Living                 Review of Systems  Constitutional: Negative for fever, chills, diaphoresis, activity change,  appetite change and fatigue.  HENT: Positive for dental problem. Negative for congestion, drooling, facial swelling, rhinorrhea and sore throat.   Eyes: Negative for photophobia and discharge.  Respiratory: Negative for cough, chest tightness and shortness of breath.   Cardiovascular: Negative for chest pain, palpitations and leg swelling.  Gastrointestinal: Negative for nausea, vomiting, abdominal pain and diarrhea.  Endocrine: Negative for polydipsia and polyuria.  Genitourinary: Negative for dysuria, frequency, difficulty urinating and pelvic pain.  Musculoskeletal: Negative for arthralgias, back pain, neck pain and neck stiffness.  Skin: Negative for color change and wound.  Allergic/Immunologic: Negative for immunocompromised state.  Neurological: Negative for facial asymmetry, weakness, numbness and headaches.  Hematological: Does not bruise/bleed easily.  Psychiatric/Behavioral: Negative for confusion and agitation.      Allergies  Review of patient's allergies indicates no known allergies.  Home Medications   Prior to Admission medications   Medication Sig Start Date End Date Taking? Authorizing Provider  Naproxen Sodium (ALEVE PO) Take 2 tablets by mouth 2 (two) times daily as needed (for pain).   Yes Historical Provider, MD  penicillin v potassium (VEETID) 500 MG tablet Take 1 tablet (500 mg total) by mouth 3 (three) times daily. 10/24/13  Yes Fayrene HelperBowie Tran, PA-C  naproxen (NAPROSYN) 500 MG tablet Take 1 tablet (500 mg total) by mouth 2 (two) times daily. 11/25/13   Shanna CiscoMegan E Shernell Saldierna, MD  penicillin v potassium (VEETID) 500 MG tablet Take 1 tablet (500 mg total) by mouth 3 (three) times daily. 11/25/13   Shanna CiscoMegan E Alita Waldren, MD   BP 159/96  Pulse 90  Temp(Src) 98.1 F (36.7 C) (Oral)  Resp 14  Ht 4\' 9"  (1.448 m)  Wt 115 lb (52.164 kg)  BMI 24.88 kg/m2  SpO2 100%  LMP 10/31/2013 Physical Exam  Constitutional: She is oriented to person, place, and time. She appears  well-developed and well-nourished. No distress.  HENT:  Head: Normocephalic and atraumatic.  Mouth/Throat: No oropharyngeal exudate.    Eyes: Pupils are equal, round, and reactive to light.  Neck: Normal range of motion. Neck supple.  Cardiovascular: Normal rate, regular rhythm and normal heart sounds.  Exam reveals no gallop and no friction rub.   No murmur heard. Pulmonary/Chest: Effort normal and breath sounds normal. No respiratory distress. She has no wheezes. She has no rales.  Abdominal: Soft. Bowel sounds are normal. She exhibits no distension and no mass. There is no tenderness. There is no rebound and no guarding.  Musculoskeletal: Normal range of motion. She exhibits no edema and no tenderness.  Neurological: She is alert and oriented to person, place, and time.  Skin: Skin is warm and dry.  Psychiatric: She has a normal mood and affect.    ED Course  Procedures (including critical care time) Labs Review Labs Reviewed - No data to display  Imaging Review No results found.   EKG Interpretation None      MDM   Final diagnoses:  Pain, dental    Pt is a 33 y.o. female with Pmhx as above who presents with dental pain. This is 3rd visit for same in past 6 months.  She has not yet seen a dentist and has not taken abx as prescribed, taking only partial rx from last visit. No fever, facial swelling, or trismus. She has multiple upper and lower L molars which are in poor condition, but no noticeable abscess.  Had further discussion w/ pt about need for dental appt for definitive treatment and limited resources to treat dental pain in the ED. Resource guide and f/u info for Dr. Lawrence Marseillesivils given. She will take last 3 days pen vk + new rx for 7 days (total of 10), and have also given naproxen.  Return precautions given for new or worsening symptoms including fever, trismus, facial swelling.          Shanna CiscoMegan E Katarina Riebe, MD 11/25/13 [phone]

## 2013-11-25 NOTE — ED Notes (Signed)
Patient presents with c/o pain to the left lower jaw where she has 4 bad teeth

## 2013-12-20 ENCOUNTER — Encounter (HOSPITAL_COMMUNITY): Payer: Self-pay | Admitting: Emergency Medicine

## 2013-12-20 ENCOUNTER — Other Ambulatory Visit: Payer: Self-pay

## 2013-12-20 ENCOUNTER — Emergency Department (HOSPITAL_COMMUNITY): Payer: No Typology Code available for payment source

## 2013-12-20 ENCOUNTER — Emergency Department (HOSPITAL_COMMUNITY)
Admission: EM | Admit: 2013-12-20 | Discharge: 2013-12-21 | Disposition: A | Discharge status: Home / Self Care | Payer: Self-pay | Attending: Emergency Medicine | Admitting: Emergency Medicine

## 2013-12-20 DIAGNOSIS — R011 Cardiac murmur, unspecified: Secondary | ICD-10-CM | POA: Insufficient documentation

## 2013-12-20 DIAGNOSIS — R197 Diarrhea, unspecified: Secondary | ICD-10-CM | POA: Insufficient documentation

## 2013-12-20 DIAGNOSIS — K0889 Other specified disorders of teeth and supporting structures: Secondary | ICD-10-CM | POA: Insufficient documentation

## 2013-12-20 DIAGNOSIS — I10 Essential (primary) hypertension: Secondary | ICD-10-CM | POA: Insufficient documentation

## 2013-12-20 DIAGNOSIS — F17200 Nicotine dependence, unspecified, uncomplicated: Secondary | ICD-10-CM | POA: Insufficient documentation

## 2013-12-20 DIAGNOSIS — Z3202 Encounter for pregnancy test, result negative: Secondary | ICD-10-CM | POA: Insufficient documentation

## 2013-12-20 DIAGNOSIS — F411 Generalized anxiety disorder: Secondary | ICD-10-CM | POA: Insufficient documentation

## 2013-12-20 DIAGNOSIS — R42 Dizziness and giddiness: Secondary | ICD-10-CM | POA: Insufficient documentation

## 2013-12-20 DIAGNOSIS — R110 Nausea: Secondary | ICD-10-CM | POA: Insufficient documentation

## 2013-12-20 DIAGNOSIS — R1084 Generalized abdominal pain: Secondary | ICD-10-CM | POA: Insufficient documentation

## 2013-12-20 DIAGNOSIS — R000 Tachycardia, unspecified: Secondary | ICD-10-CM | POA: Insufficient documentation

## 2013-12-20 DIAGNOSIS — R002 Palpitations: Secondary | ICD-10-CM | POA: Insufficient documentation

## 2013-12-20 LAB — COMPREHENSIVE METABOLIC PANEL
ALK PHOS: 68 U/L (ref 39–117)
ALT: <5 U/L (ref 0–35)
AST: 12 U/L (ref 0–37)
Albumin: 4.1 g/dL (ref 3.5–5.2)
BILIRUBIN TOTAL: 0.2 mg/dL — AB (ref 0.3–1.2)
BUN: 9 mg/dL (ref 6–23)
CHLORIDE: 100 meq/L (ref 96–112)
CO2: 26 meq/L (ref 19–32)
CREATININE: 0.69 mg/dL (ref 0.50–1.10)
Calcium: 9.5 mg/dL (ref 8.4–10.5)
GFR calc Af Amer: >90 mL/min (ref >90)
Glucose, Bld: 100 mg/dL — ABNORMAL HIGH (ref 70–99)
POTASSIUM: 3.3 meq/L — AB (ref 3.7–5.3)
Sodium: 139 mEq/L (ref 137–147)
Total Protein: 7.8 g/dL (ref 6.0–8.3)

## 2013-12-20 LAB — URINALYSIS, ROUTINE W REFLEX MICROSCOPIC
BILIRUBIN URINE: NEGATIVE
Glucose, UA: NEGATIVE mg/dL
HGB URINE DIPSTICK: NEGATIVE
Ketones, ur: NEGATIVE mg/dL
Leukocytes, UA: NEGATIVE
Nitrite: NEGATIVE
Protein, ur: NEGATIVE mg/dL
Specific Gravity, Urine: 1.015 (ref 1.005–1.030)
UROBILINOGEN UA: 1 mg/dL (ref 0.0–1.0)
pH: 6.5 (ref 5.0–8.0)

## 2013-12-20 LAB — CBC WITH DIFFERENTIAL/PLATELET
Basophils Absolute: 0.1 10*3/uL (ref 0.0–0.1)
Basophils Relative: 1 % (ref 0–1)
Eosinophils Absolute: 0 10*3/uL (ref 0.0–0.7)
Eosinophils Relative: 0 % (ref 0–5)
HEMATOCRIT: 37.5 % (ref 36.0–46.0)
HEMOGLOBIN: 12 g/dL (ref 12.0–15.0)
LYMPHS PCT: 31 % (ref 12–46)
Lymphs Abs: 3 10*3/uL (ref 0.7–4.0)
MCH: 28 pg (ref 26.0–34.0)
MCHC: 32 g/dL (ref 30.0–36.0)
MCV: 87.4 fL (ref 78.0–100.0)
MONOS PCT: 7 % (ref 3–12)
Monocytes Absolute: 0.7 10*3/uL (ref 0.1–1.0)
NEUTROS ABS: 5.9 10*3/uL (ref 1.7–7.7)
NEUTROS PCT: 61 % (ref 43–77)
Platelets: 322 10*3/uL (ref 150–400)
RBC: 4.29 MIL/uL (ref 3.87–5.11)
RDW: 14.9 % (ref 11.5–15.5)
WBC: 9.7 10*3/uL (ref 4.0–10.5)

## 2013-12-20 LAB — I-STAT TROPONIN, ED: Troponin i, poc: 0 ng/mL (ref 0.00–0.08)

## 2013-12-20 LAB — PREGNANCY, URINE: Preg Test, Ur: NEGATIVE

## 2013-12-20 LAB — LIPASE, BLOOD: LIPASE: 25 U/L (ref 11–59)

## 2013-12-20 MED ORDER — POTASSIUM CHLORIDE CRYS ER 20 MEQ PO TBCR
40.0000 meq | EXTENDED_RELEASE_TABLET | Freq: Once | ORAL | Status: AC
  Administered 2013-12-20: 40 meq via ORAL
  Filled 2013-12-20: qty 2

## 2013-12-20 MED ORDER — DIPHENOXYLATE-ATROPINE 2.5-0.025 MG PO TABS
1.0000 | ORAL_TABLET | Freq: Once | ORAL | Status: AC
Start: 2013-12-20 — End: 2013-12-20
  Administered 2013-12-20: 1 via ORAL
  Filled 2013-12-20: qty 1

## 2013-12-20 MED ORDER — SODIUM CHLORIDE 0.9 % IV BOLUS (SEPSIS)
1000.0000 mL | Freq: Once | INTRAVENOUS | Status: AC
  Administered 2013-12-20: 1000 mL via INTRAVENOUS

## 2013-12-20 MED ORDER — LORAZEPAM 1 MG PO TABS
1.0000 mg | ORAL_TABLET | Freq: Once | ORAL | Status: AC
  Administered 2013-12-20: 1 mg via ORAL
  Filled 2013-12-20: qty 1

## 2013-12-20 NOTE — ED Notes (Signed)
Tatyana, PA at the bedside  

## 2013-12-20 NOTE — ED Notes (Signed)
Pt c/o diarrhea since Monday along with intermittent RLQ abd pain and heart palpitations since Monday. sts she has also felt lightheaded at times. sts some nausea but no vomiting. sts some cold chills at home but never checked her temp. Nad, skin warm and dry, resp e/u.

## 2013-12-20 NOTE — ED Notes (Signed)
Patient placed on contact precautions

## 2013-12-20 NOTE — ED Provider Notes (Signed)
CSN: [id number]     Arrival date & time 12/20/13  1850 History   First MD Initiated Contact with Patient 12/20/13 1953     Chief Complaint  Patient presents with  . Chest Pain  . Diarrhea     (Consider location/radiation/quality/duration/timing/severity/associated sxs/prior Treatment) HPI Jamie Pratt is a 33 y.o. female who presents to emergency department complaining of diarrhea for 4 days, lower abdominal cramping, palpitations for 2 days. He states feels weak and lightheaded. States she has had some nausea but no vomiting. She has been drinking lots of fluids, she's not eating. Patient denies any fevers, she did not check her temperature. She states she has history of heart murmur, heart valve regurgitation, A. fib, hypertension. She states she lost her insurance and she has not followed up with a cardiologist or primary care Dr. She continues to smoke. She states she also has had a lot of stress and anxiety, states her brother was murdered 2 months ago. States she's been having a lot of headaches. Patient states she had Phenergan and took Phenergan, continues to have nausea but not throwing up. Did not take anything for diarrhea. Patient also states she has been treated for dental infection and has finished a course of antibiotics a week ago. Patient states she has been gargling symptoms, she is worried she may have A. fib again, also worried that she may have a blood or heart infection that may have, from her dental infection.   Past Medical History  Diagnosis Date  . Hypertension   . Murmur, heart   . Heart valve regurgitation    Past Surgical History  Procedure Laterality Date  . Cesarean section    . Cholecystectomy    . Mouth surgery    . Tubal ligation     Family History  Problem Relation Age of Onset  . Hypertension Mother   . Diabetes Mother   . Hypertension Father   . Diabetes Father    History  Substance Use Topics  . Smoking status: Current Every Day Smoker --  0.50 packs/day    Types: Cigarettes  . Smokeless tobacco: Not on file  . Alcohol Use: No   OB History   Grav Para Term Preterm Abortions TAB SAB Ect Mult Living                 Review of Systems  Constitutional: Negative for fever and chills.  HENT: Positive for dental problem.   Respiratory: Negative for cough, chest tightness and shortness of breath.   Cardiovascular: Positive for palpitations. Negative for chest pain and leg swelling.  Gastrointestinal: Positive for nausea, abdominal pain and diarrhea. Negative for vomiting.  Genitourinary: Negative for dysuria, flank pain and pelvic pain.  Musculoskeletal: Negative for arthralgias, myalgias, neck pain and neck stiffness.  Skin: Negative for rash.  Neurological: Positive for light-headedness. Negative for dizziness, weakness and headaches.  All other systems reviewed and are negative.     Allergies  Review of patient's allergies indicates no known allergies.  Home Medications   Prior to Admission medications   Medication Sig Start Date End Date Taking? Authorizing Provider  ibuprofen (ADVIL,MOTRIN) 800 MG tablet Take 800 mg by mouth every 8 (eight) hours as needed.   Yes Historical Provider, MD  promethazine (PHENERGAN) 25 MG tablet Take 25 mg by mouth every 6 (six) hours as needed for nausea or vomiting.   Yes Historical Provider, MD   BP 164/88  Pulse 101  Temp(Src) 98.3 F (36.8 C) (  Oral)  Resp 10  Ht 4\' 9"  (1.448 m)  Wt 110 lb (49.896 kg)  BMI 23.80 kg/m2  SpO2 100%  LMP 12/05/2013 Physical Exam  Nursing note and vitals reviewed. Constitutional: She is oriented to person, place, and time. She appears well-developed and well-nourished.  Pt is crying, appears to be anxious  HENT:  Head: Normocephalic.  Eyes: Conjunctivae are normal.  Neck: Neck supple.  Cardiovascular: Normal rate, regular rhythm and normal heart sounds.   Pulmonary/Chest: Effort normal and breath sounds normal. No respiratory distress. She  has no wheezes. She has no rales.  Abdominal: Soft. Bowel sounds are normal. She exhibits no distension. There is no tenderness. There is no rebound and no guarding.  Musculoskeletal: She exhibits no edema.  Neurological: She is alert and oriented to person, place, and time.  Skin: Skin is warm and dry.  Psychiatric: Her behavior is normal. Thought content normal.    ED Course  Procedures (including critical care time) Labs Review Labs Reviewed  COMPREHENSIVE METABOLIC PANEL - Abnormal; Notable for the following:    Potassium 3.3 (*)    Glucose, Bld 100 (*)    Total Bilirubin 0.2 (*)    All other components within normal limits  URINALYSIS, ROUTINE W REFLEX MICROSCOPIC - Abnormal; Notable for the following:    APPearance CLOUDY (*)    All other components within normal limits  CLOSTRIDIUM DIFFICILE BY PCR  CBC WITH DIFFERENTIAL  PREGNANCY, URINE  LIPASE, BLOOD  I-STAT TROPOININ, ED  POC URINE PREG, ED    Imaging Review Dg Abd Acute W/chest  12/20/2013   CLINICAL DATA:  Abdominal and chest pain.  EXAM: ACUTE ABDOMEN SERIES (ABDOMEN 2 VIEW & CHEST 1 VIEW)  COMPARISON:  None currently available  FINDINGS: There is no evidence of dilated bowel loops or free intraperitoneal air. No radiopaque calculi or other significant radiographic abnormality is seen. Cholecystectomy clips. Heart size and mediastinal contours are within normal limits. Both lungs are clear.  IMPRESSION: Negative abdominal radiographs.  No acute cardiopulmonary disease.   Electronically Signed   By: Tiburcio Pea M.D.   On: 12/20/2013 21:46     EKG Interpretation None      Date: 12/21/2013  Rate: 115  Rhythm: sinus tachycardia  QRS Axis: normal  Intervals: normal  ST/T Wave abnormalities: nonspecific ST/T changes  Conduction Disutrbances:none  Narrative Interpretation:   Old EKG Reviewed: none available   MDM   Final diagnoses:  Diarrhea  Palpitation    A shunt with persistent diarrhea for 4 days,  palpitations, lightheadedness. She is tachycardic on EKG, sinus rhythm, abdomen is diffusely tender, no guarding, no peritoneal signs. Patient has recently been on antibiotics for 2 weeks for dental infection. Concerning for possible C. difficile. Cdiff PCR ordered, will check labs, electrolytes, acute abdominal series. Will try Ativan and Lomotil for her symptoms  11:30 PM  Patient is feeling better, she is in no acute distress, drinking or eating in ED. VS normal now, with HR down in 80s after fluids. Unable to give stool sample. We'll monitor and try again  12:15 AM Pt unable to give any stool, tried several times. Pt is in no distress here in ED. Nontoxic.  CP resolved. Now not tachy, not hypoxic, not tachypnec, doubt PE. No risk factors for ACS.  VS normal. HOme with oral fluids, immodium, follow up with pcp.   Filed Vitals:   12/20/13 2205 12/20/13 2207 12/20/13 2208 12/20/13 2215  BP: 152/86 149/111 154/104 139/104  Pulse:  74 81  89  Temp:      TempSrc:      Resp:    15  Height:      Weight:      SpO2:    100%       Lottie Musselatyana A Patriciaann Rabanal, PA-C 12/21/13 0035

## 2013-12-20 NOTE — ED Notes (Signed)
Patient helped up to the bathroom.  

## 2013-12-20 NOTE — ED Notes (Signed)
Patient returned from X-ray 

## 2013-12-21 MED ORDER — LOPERAMIDE HCL 2 MG PO CAPS: 2.0000 mg | ORAL_CAPSULE | Freq: Four times a day (QID) | ORAL | Status: DC | PRN

## 2013-12-21 NOTE — Discharge Instructions (Signed)
Your lab work and x-rays all normal here. Make sure to drink plenty of fluids to avoid dehydration. Take imodium as prescribed for diarrhea. If continue to have severe diarrhea, please follow up with primary care doctor or return to ED.   Diarrhea Diarrhea is frequent loose and watery bowel movements. It can cause you to feel weak and dehydrated. Dehydration can cause you to become tired and thirsty, have a dry mouth, and have decreased urination that often is dark yellow. Diarrhea is a sign of another problem, most often an infection that will not last long. In most cases, diarrhea typically lasts 2 3 days. However, it can last longer if it is a sign of something more serious. It is important to treat your diarrhea as directed by your caregive to lessen or prevent future episodes of diarrhea. CAUSES  Some common causes include:  Gastrointestinal infections caused by viruses, bacteria, or parasites.  Food poisoning or food allergies.  Certain medicines, such as antibiotics, chemotherapy, and laxatives.  Artificial sweeteners and fructose.  Digestive disorders. HOME CARE INSTRUCTIONS  Ensure adequate fluid intake (hydration): have 1 cup (8 oz) of fluid for each diarrhea episode. Avoid fluids that contain simple sugars or sports drinks, fruit juices, whole milk products, and sodas. Your urine should be clear or pale yellow if you are drinking enough fluids. Hydrate with an oral rehydration solution that you can purchase at pharmacies, retail stores, and online. You can prepare an oral rehydration solution at home by mixing the following ingredients together:    tsp table salt.   tsp baking soda.   tsp salt substitute containing potassium chloride.  1  tablespoons sugar.  1 L (34 oz) of water.  Certain foods and beverages may increase the speed at which food moves through the gastrointestinal (GI) tract. These foods and beverages should be avoided and include:  Caffeinated and alcoholic  beverages.  High-fiber foods, such as raw fruits and vegetables, nuts, seeds, and whole grain breads and cereals.  Foods and beverages sweetened with sugar alcohols, such as xylitol, sorbitol, and mannitol.  Some foods may be well tolerated and may help thicken stool including:  Starchy foods, such as rice, toast, pasta, low-sugar cereal, oatmeal, grits, baked potatoes, crackers, and bagels.  Bananas.  Applesauce.  Add probiotic-rich foods to help increase healthy bacteria in the GI tract, such as yogurt and fermented milk products.  Wash your hands well after each diarrhea episode.  Only take over-the-counter or prescription medicines as directed by your caregiver.  Take a warm bath to relieve any burning or pain from frequent diarrhea episodes. SEEK IMMEDIATE MEDICAL CARE IF:   You are unable to keep fluids down.  You have persistent vomiting.  You have blood in your stool, or your stools are black and tarry.  You do not urinate in 6 8 hours, or there is only a small amount of very dark urine.  You have abdominal pain that increases or localizes.  You have weakness, dizziness, confusion, or lightheadedness.  You have a severe headache.  Your diarrhea gets worse or does not get better.  You have a fever or persistent symptoms for more than 2 3 days.  You have a fever and your symptoms suddenly get worse. MAKE SURE YOU:   Understand these instructions.  Will watch your condition.  Will get help right away if you are not doing well or get worse. Document Released: 06/25/2002 Document Revised: 06/21/2012 Document Reviewed: 03/12/2012 [facility] Patient Information 2014 [place], [place].

## 2013-12-24 NOTE — ED Provider Notes (Signed)
Medical screening examination/treatment/procedure(s) were performed by non-physician practitioner and as supervising physician I was immediately available for consultation/collaboration.   EKG Interpretation None        Gwyneth Sprout, MD 12/24/13 2103

## 2014-01-10 ENCOUNTER — Ambulatory Visit: Payer: Self-pay

## 2014-03-01 ENCOUNTER — Emergency Department (HOSPITAL_COMMUNITY)
Admission: EM | Admit: 2014-03-01 | Discharge: 2014-03-01 | Disposition: A | Discharge status: Home / Self Care | Payer: Self-pay | Attending: Emergency Medicine | Admitting: Emergency Medicine

## 2014-03-01 ENCOUNTER — Encounter (HOSPITAL_COMMUNITY): Payer: Self-pay | Admitting: Emergency Medicine

## 2014-03-01 ENCOUNTER — Emergency Department (HOSPITAL_COMMUNITY): Payer: No Typology Code available for payment source

## 2014-03-01 DIAGNOSIS — S6991XA Unspecified injury of right wrist, hand and finger(s), initial encounter: Secondary | ICD-10-CM

## 2014-03-01 DIAGNOSIS — Y9389 Activity, other specified: Secondary | ICD-10-CM | POA: Insufficient documentation

## 2014-03-01 DIAGNOSIS — F17200 Nicotine dependence, unspecified, uncomplicated: Secondary | ICD-10-CM | POA: Insufficient documentation

## 2014-03-01 DIAGNOSIS — S6990XA Unspecified injury of unspecified wrist, hand and finger(s), initial encounter: Principal | ICD-10-CM | POA: Insufficient documentation

## 2014-03-01 DIAGNOSIS — I10 Essential (primary) hypertension: Secondary | ICD-10-CM | POA: Insufficient documentation

## 2014-03-01 DIAGNOSIS — R011 Cardiac murmur, unspecified: Secondary | ICD-10-CM | POA: Insufficient documentation

## 2014-03-01 DIAGNOSIS — S6980XA Other specified injuries of unspecified wrist, hand and finger(s), initial encounter: Secondary | ICD-10-CM | POA: Insufficient documentation

## 2014-03-01 DIAGNOSIS — Y9289 Other specified places as the place of occurrence of the external cause: Secondary | ICD-10-CM | POA: Insufficient documentation

## 2014-03-01 DIAGNOSIS — W230XXA Caught, crushed, jammed, or pinched between moving objects, initial encounter: Secondary | ICD-10-CM | POA: Insufficient documentation

## 2014-03-01 NOTE — ED Notes (Addendum)
States, "closed sliding door on rt. Middle finger."  Thinks it is broken towards finger tip. Can bend finger. Cms intact.

## 2014-03-01 NOTE — ED Provider Notes (Signed)
CSN: [id number]     Arrival date & time 03/01/14  1903 History  This chart was scribed for non-physician practitioner, Santiago Glad, PA-C working with Doug Sou, MD by Greggory Stallion, ED scribe. This patient was seen in room TR09C/TR09C and the patient's care was started at 8:40 PM.   Chief Complaint  Patient presents with  . Finger Injury   The history is provided by the patient. No language interpreter was used.   HPI Comments: Jamie Pratt is a 33 y.o. female who presents to the Emergency Department complaining of right middle finger injury that occurred yesterday. States she closed it in a sliding door. Reports sudden onset pain with associated swelling and tingling to the tip of the finger. Movement of the finger worsens the pain. Pt took motrin with some relief. Denies numbness.   Past Medical History  Diagnosis Date  . Hypertension   . Murmur, heart   . Heart valve regurgitation    Past Surgical History  Procedure Laterality Date  . Cesarean section    . Cholecystectomy    . Mouth surgery    . Tubal ligation     Family History  Problem Relation Age of Onset  . Hypertension Mother   . Diabetes Mother   . Hypertension Father   . Diabetes Father    History  Substance Use Topics  . Smoking status: Current Every Day Smoker -- 0.50 packs/day    Types: Cigarettes  . Smokeless tobacco: Not on file  . Alcohol Use: No   OB History   Grav Para Term Preterm Abortions TAB SAB Ect Mult Living                 Review of Systems  Musculoskeletal: Positive for arthralgias and joint swelling.  Skin: Positive for wound.  Neurological: Negative for numbness.  All other systems reviewed and are negative.  Allergies  Review of patient's allergies indicates no known allergies.  Home Medications   Prior to Admission medications   Not on File   BP 114/82  Pulse 87  Temp(Src) 98.2 F (36.8 C) (Oral)  Resp 14  SpO2 100%  LMP 02/05/2014  Physical Exam  Nursing  note and vitals reviewed. Constitutional: She appears well-developed and well-nourished.  HENT:  Head: Normocephalic and atraumatic.  Mouth/Throat: Oropharynx is clear and moist.  Eyes: EOM are normal. Pupils are equal, round, and reactive to light.  Neck: Normal range of motion. Neck supple.  Cardiovascular: Normal rate, regular rhythm and normal heart sounds.   2+ radial pulse.  Pulmonary/Chest: Effort normal and breath sounds normal. She has no wheezes.  Musculoskeletal: Normal range of motion.  Tenderness to palpation of the phalange between the PIP and the DIP and distal to the DIP. No lacerations. Skin intact. No obvious bruising, edema or erythema. Full ROM of finger art the level of the MCP, PIP and DIP. Good capillary refill.   Neurological: She is alert.  Skin: Skin is warm and dry.  Psychiatric: She has a normal mood and affect. Her behavior is normal.    ED Course  Procedures (including critical care time)  DIAGNOSTIC STUDIES: Oxygen Saturation is 100% on RA, normal by my interpretation.    COORDINATION OF CARE: 8:42 PM-Discussed treatment plan which includes ice and continuing motrin with pt at bedside and pt agreed to plan.   Labs Review Labs Reviewed - No data to display  Imaging Review Dg Finger Middle Right  03/01/2014   CLINICAL DATA:  Close  right middle finger in door.  EXAM: RIGHT MIDDLE FINGER 2+V  COMPARISON:  None.  FINDINGS: No acute bony abnormality. Specifically, no fracture, subluxation, or dislocation. Soft tissues are intact.  IMPRESSION: Negative.   Electronically Signed   By: Charlett NoseKevin  Dover M.D.   On: 03/01/2014 20:10     EKG Interpretation None      MDM   Final diagnoses:  None   Patient presenting with finger injury.  Xray negative.  Skin intact.  Patient neurovascularly intact.  Patient stable for discharge.  I personally performed the services described in this documentation, which was scribed in my presence. The recorded information has  been reviewed and is accurate.  Santiago GladHeather Alorah Mcree, PA-C 03/01/14 2103

## 2014-03-01 NOTE — Progress Notes (Signed)
Orthopedic Tech Progress Note Patient Details:  Blima SingerMelissa R Aki 1981/02/07 [id number]  Ortho Devices Type of Ortho Device: Finger splint Ortho Device/Splint Location: RUE Ortho Device/Splint Interventions: Ordered;Application   Jennye MoccasinHughes, Cissy Galbreath Craig 03/01/2014, 9:07 PM

## 2014-03-01 NOTE — ED Notes (Signed)
Pt st's she closed her finger in glass door last night.  Pt c/o pain to right middle finger at the DIP joint.

## 2014-03-02 NOTE — ED Provider Notes (Signed)
Medical screening examination/treatment/procedure(s) were performed by non-physician practitioner and as supervising physician I was immediately available for consultation/collaboration.   EKG Interpretation None       Doug SouSam Kaegan Stigler, MD 03/02/14 [phone]

## 2015-06-01 ENCOUNTER — Emergency Department (HOSPITAL_COMMUNITY)
Admission: EM | Admit: 2015-06-01 | Discharge: 2015-06-01 | Disposition: A | Discharge status: Home / Self Care | Payer: Medicaid Other | Attending: Physician Assistant | Admitting: Physician Assistant

## 2015-06-01 ENCOUNTER — Encounter (HOSPITAL_COMMUNITY): Payer: Self-pay

## 2015-06-01 DIAGNOSIS — K08409 Partial loss of teeth, unspecified cause, unspecified class: Secondary | ICD-10-CM | POA: Insufficient documentation

## 2015-06-01 DIAGNOSIS — R011 Cardiac murmur, unspecified: Secondary | ICD-10-CM | POA: Diagnosis not present

## 2015-06-01 DIAGNOSIS — Z87891 Personal history of nicotine dependence: Secondary | ICD-10-CM | POA: Insufficient documentation

## 2015-06-01 DIAGNOSIS — K1379 Other lesions of oral mucosa: Secondary | ICD-10-CM | POA: Insufficient documentation

## 2015-06-01 DIAGNOSIS — B370 Candidal stomatitis: Secondary | ICD-10-CM | POA: Diagnosis not present

## 2015-06-01 DIAGNOSIS — I10 Essential (primary) hypertension: Secondary | ICD-10-CM | POA: Diagnosis not present

## 2015-06-01 DIAGNOSIS — K0889 Other specified disorders of teeth and supporting structures: Secondary | ICD-10-CM | POA: Diagnosis present

## 2015-06-01 MED ORDER — NYSTATIN 100000 UNIT/ML MT SUSP: 5.0000 mL | Freq: Four times a day (QID) | OROMUCOSAL | Status: DC

## 2015-06-01 NOTE — Discharge Instructions (Signed)
Use nystatin swish and spit, retaining it in your mouth as long as possible, as instructed to treat the thrush infection. Continue your other home medications. Call your dentist tomorrow to schedule a follow up appointment early this week. Return to the ER for changes or worsening symptoms.    Thrush, Adult  Thrush is Ginette Pitmanan infection that can happen on the mouth, throat, tongue, or other areas. It causes white patches to form on the mouth and tongue. HOME CARE  Only take medicine as told by your doctor. You may be given medicine to swallow or to apply right on the area.  Eat plain yogurt that contains live cultures (check the label).  Rinse your mouth many times a day with a warm saltwater rinse. To make the rinse, mix 1 teaspoon (6 g) of salt in 8 ounces (0.2 L) of warm water. To reduce pain:  Drink cold liquids such as water or iced tea.  Eat frozen ice pops or frozen juices.  Eat foods that are easy to swallow, such as gelatin or ice cream.  Drink from a straw if the patches are painful. If you are breastfeeding:  Clean your nipples with an antifungal medicine.  Dry your nipples after breastfeeding.  Use an ointment called lanolin to help relieve nipple soreness. If you wear dentures:  Take out your dentures before going to bed.  Brush them thoroughly.  Soak them in a denture cleaner. GET HELP IF:   Your problems are getting worse.  Your problems are not improving within 7 days of starting treatment.  Your infection is spreading. This may show as white patches on the skin outside of your mouth.  You are nursing and have redness and pain in the nipples. MAKE SURE YOU:  Understand these instructions.  Will watch your condition.  Will get help right away if you are not doing well or get worse.   This information is not intended to replace advice given to you by your health care provider. Make sure you discuss any questions you have with your health care provider.     Document Released: 09/29/2009 Document Revised: 04/25/2013 Document Reviewed: 02/05/2013 [employer] Interactive Patient Education [employer]2016 [employer]

## 2015-06-01 NOTE — ED Provider Notes (Signed)
CSN: [id number]     Arrival date & time 06/01/15  1131 History  By signing my name below, I, Jamie Pratt, attest that this documentation has been prepared under the direction and in the presence of Olegario Emberson Camprubi-Soms-PA-C. Electronically Signed: Elon Pratt ED Scribe. 06/01/2015. 12:06 PM.    Chief Complaint  Patient presents with  . Dental Pain   Patient is a 34 y.o. female presenting with tooth pain. The history is provided by the patient. No language interpreter was used.  Dental Pain Location:  Generalized Quality:  No pain Severity:  No pain Onset quality:  Sudden Duration:  5 days Timing:  Rare Progression:  Unchanged Chronicity:  New Context: recent dental surgery   Context comment:  29 extractions Previous work-up:  Dental exam Relieved by: Percocet. Worsened by:  Nothing tried Ineffective treatments:  None tried Associated symptoms: facial swelling (same as day of surgery) and oral lesions   Associated symptoms: no drooling, no fever, no gum swelling, no neck swelling, no oral bleeding and no trismus   Risk factors: sufficient dental care    HPI Comments: Jamie Pratt is a 34 y.o. female who presents to the Emergency Department complaining of non-painful, unchanged, dental swelling onset 5 days ago after having 29 teeth extracted.  She takes 100 mg methadone daily and was prescribed Percocet and Amoxicillin after the extraction and she has been compliant. Her primary motivator for coming in to the ED today was concern over infection because she has some white stuff on her gums.  She has not contacted her dentist because the clinic is not open over the weekend.  She denies drainage, worsened swelling, fever chills, CP, SOB, abdominal pain, diarrhea, constipation, ear pain/drainage, hematuria, dysuria, numbness, weakness. No ear pain/drainage, trismus, drooling, sore throat, or URI symptoms.   Past Medical History  Diagnosis Date  . Hypertension   . Murmur, heart    . Heart valve regurgitation    Past Surgical History  Procedure Laterality Date  . Cesarean section    . Cholecystectomy    . Mouth surgery    . Tubal ligation     Family History  Problem Relation Age of Onset  . Hypertension Mother   . Diabetes Mother   . Hypertension Father   . Diabetes Father    Social History  Substance Use Topics  . Smoking status: Former Smoker -- 0.00 packs/day    Quit date: 05/27/2015  . Smokeless tobacco: Not on file  . Alcohol Use: No   OB History    No data available     Review of Systems  Constitutional: Negative for fever and chills.  HENT: Positive for dental problem, facial swelling (same as day of surgery) and mouth sores. Negative for drooling, ear discharge, ear pain, rhinorrhea and sore throat.   Respiratory: Negative for shortness of breath.   Cardiovascular: Negative for chest pain.  Gastrointestinal: Negative for nausea, vomiting, abdominal pain, diarrhea and constipation.  Genitourinary: Negative for dysuria and hematuria.  Allergic/Immunologic: Negative for immunocompromised state.  Neurological: Negative for weakness and numbness.   10 Systems reviewed and all are negative for acute change except as noted in the HPI.   Allergies  Review of patient's allergies indicates no known allergies.  Home Medications   Prior to Admission medications   Not on File   BP 137/89 mmHg  Pulse 75  Temp(Src) 98.2 F (36.8 C) (Oral)  Resp 18  SpO2 100%  LMP 05/01/2015 Physical Exam  Constitutional: She  is oriented to person, place, and time. Vital signs are normal. She appears well-developed and well-nourished.  Non-toxic appearance. No distress.  Afebrile, nontoxic, NAD  HENT:  Head: Normocephalic and atraumatic.  Nose: Nose normal.  Mouth/Throat: Oropharynx is clear and moist and mucous membranes are normal. Oral lesions (white plaques that scrape off) present. No trismus in the jaw. Abnormal dentition (edentulous). No dental  abscesses, uvula swelling or dental caries.  Edentulous with multiple sutures noted to gumlines diffusely throughout entire mouth, with multiple areas of white plaque that scrape revealing an erythematous base, most notable in the upper left and lower right gum lines. No evidence of dental abscess. Nose clear. Oropharynx without uvular swelling or deviation, no trismus or drooling, no tonsillar swelling or erythema, no exudates.    Eyes: Conjunctivae and EOM are normal. Right eye exhibits no discharge. Left eye exhibits no discharge.  Neck: Normal range of motion. Neck supple.  Cardiovascular: Normal rate.   Pulmonary/Chest: Effort normal. No respiratory distress.  Abdominal: Normal appearance. She exhibits no distension.  Musculoskeletal: Normal range of motion.  Neurological: She is alert and oriented to person, place, and time. She has normal strength. No sensory deficit.  Skin: Skin is warm, dry and intact. No rash noted.  Psychiatric: She has a normal mood and affect. Her behavior is normal.  Nursing note and vitals reviewed.   ED Course  Procedures (including critical care time)  DIAGNOSTIC STUDIES: Oxygen Saturation is 100% on RA, normal by my interpretation.    COORDINATION OF CARE:  12:10 PM Will order nystatin.  Patient advised to continue using her prescribed antibiotic.  Patient acknowledges and agrees with plan.    Labs Review Labs Reviewed - No data to display  Imaging Review No results found. I have personally reviewed and evaluated these images and lab results as part of my medical decision-making.   EKG Interpretation None      MDM   Final diagnoses:  Thrush, oral  S/P tooth extraction, unspecified edentulism    34 y.o. female here with recent dental extraction now complaining of white plaques on gumline, consistent with thrush. Will treat with nystatin and have her f/up with her dentist tomorrow. I explained the diagnosis and have given explicit  precautions to return to the ER including for any other new or worsening symptoms. The patient understands and accepts the medical plan as it's been dictated and I have answered their questions. Discharge instructions concerning home care and prescriptions have been given. The patient is STABLE and is discharged to home in good condition.   I personally performed the services described in this documentation, which was scribed in my presence. The recorded information has been reviewed and is accurate.  BP 137/89 mmHg  Pulse 75  Temp(Src) 98.2 F (36.8 C) (Oral)  Resp 18  SpO2 100%  LMP 05/01/2015  Meds ordered this encounter  Medications  . nystatin (MYCOSTATIN) 100000 UNIT/ML suspension    Sig: Take 5 mLs (500,000 Units total) by mouth 4 (four) times daily. x5 days    Dispense:  120 mL    Refill:  0    Order Specific Question:  Supervising Provider    Answer:  Eber HongMILLER, BRIAN [[id number]]      Rayson Rando Camprubi-Soms, PA-C 06/01/15 1220  Courteney Randall AnLyn Mackuen, MD 06/01/15 1635

## 2015-06-01 NOTE — ED Notes (Signed)
Pt reports had 29 teeth removed on 05-27-15.  Pt wants someone to look at mouth and make sure no infection.  Pt is not complaining of pain, is taking Percocet, Amoxicillin and Methadone 100 mg.  Pt was on Methadone 120 mg prior to teeth removal.  No fevers.

## 2018-10-17 ENCOUNTER — Emergency Department (HOSPITAL_COMMUNITY): Payer: Medicaid Other

## 2018-10-17 ENCOUNTER — Other Ambulatory Visit: Payer: Self-pay

## 2018-10-17 ENCOUNTER — Emergency Department (HOSPITAL_COMMUNITY)
Admission: EM | Admit: 2018-10-17 | Discharge: 2018-10-17 | Disposition: A | Discharge status: Home / Self Care | Payer: Medicaid Other | Attending: Emergency Medicine | Admitting: Emergency Medicine

## 2018-10-17 ENCOUNTER — Encounter (HOSPITAL_COMMUNITY): Payer: Self-pay | Admitting: Emergency Medicine

## 2018-10-17 DIAGNOSIS — N3001 Acute cystitis with hematuria: Secondary | ICD-10-CM | POA: Diagnosis not present

## 2018-10-17 DIAGNOSIS — R1031 Right lower quadrant pain: Secondary | ICD-10-CM | POA: Diagnosis present

## 2018-10-17 DIAGNOSIS — I10 Essential (primary) hypertension: Secondary | ICD-10-CM | POA: Insufficient documentation

## 2018-10-17 DIAGNOSIS — Z87891 Personal history of nicotine dependence: Secondary | ICD-10-CM | POA: Diagnosis not present

## 2018-10-17 DIAGNOSIS — N898 Other specified noninflammatory disorders of vagina: Secondary | ICD-10-CM

## 2018-10-17 LAB — CBC WITH DIFFERENTIAL/PLATELET
Abs Immature Granulocytes: 0.02 10*3/uL (ref 0.00–0.07)
Basophils Absolute: 0.1 10*3/uL (ref 0.0–0.1)
Basophils Relative: 1 %
Eosinophils Absolute: 0.1 10*3/uL (ref 0.0–0.5)
Eosinophils Relative: 1 %
HCT: 37.2 % (ref 36.0–46.0)
Hemoglobin: 11.1 g/dL — ABNORMAL LOW (ref 12.0–15.0)
Immature Granulocytes: 0 %
Lymphocytes Relative: 34 %
Lymphs Abs: 2.9 10*3/uL (ref 0.7–4.0)
MCH: 24.9 pg — ABNORMAL LOW (ref 26.0–34.0)
MCHC: 29.8 g/dL — ABNORMAL LOW (ref 30.0–36.0)
MCV: 83.4 fL (ref 80.0–100.0)
Monocytes Absolute: 0.5 10*3/uL (ref 0.1–1.0)
Monocytes Relative: 5 %
Neutro Abs: 5.2 10*3/uL (ref 1.7–7.7)
Neutrophils Relative %: 59 %
Platelets: 408 10*3/uL — ABNORMAL HIGH (ref 150–400)
RBC: 4.46 MIL/uL (ref 3.87–5.11)
RDW: 16 % — ABNORMAL HIGH (ref 11.5–15.5)
WBC: 8.7 10*3/uL (ref 4.0–10.5)
nRBC: 0 % (ref 0.0–0.2)

## 2018-10-17 LAB — URINALYSIS, ROUTINE W REFLEX MICROSCOPIC
Bilirubin Urine: NEGATIVE
Glucose, UA: NEGATIVE mg/dL
Ketones, ur: NEGATIVE mg/dL
Leukocytes,Ua: NEGATIVE
Nitrite: POSITIVE — AB
Protein, ur: NEGATIVE mg/dL
Specific Gravity, Urine: 1.019 (ref 1.005–1.030)
pH: 5 (ref 5.0–8.0)

## 2018-10-17 LAB — COMPREHENSIVE METABOLIC PANEL
ALT: 9 U/L (ref 0–44)
AST: 16 U/L (ref 15–41)
Albumin: 3.8 g/dL (ref 3.5–5.0)
Alkaline Phosphatase: 59 U/L (ref 38–126)
Anion gap: 7 (ref 5–15)
BUN: 8 mg/dL (ref 6–20)
CO2: 23 mmol/L (ref 22–32)
Calcium: 8.8 mg/dL — ABNORMAL LOW (ref 8.9–10.3)
Chloride: 106 mmol/L (ref 98–111)
Creatinine, Ser: 0.69 mg/dL (ref 0.44–1.00)
GFR calc Af Amer: >60 mL/min (ref >60)
GFR calc non Af Amer: >60 mL/min (ref >60)
Glucose, Bld: 87 mg/dL (ref 70–99)
Potassium: 3.7 mmol/L (ref 3.5–5.1)
Sodium: 136 mmol/L (ref 135–145)
Total Bilirubin: 0.3 mg/dL (ref 0.3–1.2)
Total Protein: 7 g/dL (ref 6.5–8.1)

## 2018-10-17 LAB — WET PREP, GENITAL
Sperm: NONE SEEN
Trich, Wet Prep: NONE SEEN
Yeast Wet Prep HPF POC: NONE SEEN

## 2018-10-17 LAB — LIPASE, BLOOD: Lipase: 23 U/L (ref 11–51)

## 2018-10-17 LAB — I-STAT BETA HCG BLOOD, ED (MC, WL, AP ONLY): I-stat hCG, quantitative: <5 m[IU]/mL (ref <5)

## 2018-10-17 MED ORDER — MORPHINE SULFATE (PF) 4 MG/ML IV SOLN
4.0000 mg | Freq: Once | INTRAVENOUS | Status: AC
  Administered 2018-10-17: 4 mg via INTRAVENOUS
  Filled 2018-10-17: qty 1

## 2018-10-17 MED ORDER — CEFTRIAXONE SODIUM 250 MG IJ SOLR
250.0000 mg | Freq: Once | INTRAMUSCULAR | Status: AC
  Administered 2018-10-17: 250 mg via INTRAMUSCULAR
  Filled 2018-10-17: qty 250

## 2018-10-17 MED ORDER — IOPAMIDOL (ISOVUE-300) INJECTION 61%
100.0000 mL | Freq: Once | INTRAVENOUS | Status: AC | PRN
  Administered 2018-10-17: 100 mL via INTRAVENOUS

## 2018-10-17 MED ORDER — METRONIDAZOLE 500 MG PO TABS: 500.0000 mg | ORAL_TABLET | Freq: Two times a day (BID) | ORAL | 0 refills | Status: DC

## 2018-10-17 MED ORDER — ONDANSETRON HCL 4 MG/2ML IJ SOLN
4.0000 mg | Freq: Once | INTRAMUSCULAR | Status: AC
  Administered 2018-10-17: 4 mg via INTRAVENOUS
  Filled 2018-10-17: qty 2

## 2018-10-17 MED ORDER — CEPHALEXIN 500 MG PO CAPS: 500.0000 mg | ORAL_CAPSULE | Freq: Two times a day (BID) | ORAL | 0 refills | Status: DC

## 2018-10-17 MED ORDER — AZITHROMYCIN 250 MG PO TABS
1000.0000 mg | ORAL_TABLET | Freq: Once | ORAL | Status: AC
  Administered 2018-10-17: 1000 mg via ORAL
  Filled 2018-10-17: qty 4

## 2018-10-17 NOTE — ED Provider Notes (Signed)
[facility] EMERGENCY DEPARTMENT Provider Note   CSN: [id number] Arrival date & time: 10/17/18  0865    History   Chief Complaint Chief Complaint  Patient presents with   Abdominal Pain    HPI Jamie Pratt is a 38 y.o. female.     HPI   38 year old female presents today with complaints of right lower quadrant abdominal pain.  Patient notes symptoms started approximately 1 week ago with right sided abdominal pain.  She notes both right and upper abdomen hurt presently worse in the right lower quadrant.  She notes no associated nausea vomiting or diarrhea.  She notes no fever.  No history of abdominal surgeries.  No medications prior to arrival.  She denies any vaginal discharge or bleeding.  She notes the pain started slowly.  She notes she is sexually active with males only.  She notes malodorous urine.  Last BM was yesterday and was normal.  She notes a history of C-sections and cholecystectomy.  Past Medical History:  Diagnosis Date   Heart valve regurgitation    Hypertension    Murmur, heart     There are no active problems to display for this patient.   Past Surgical History:  Procedure Laterality Date   CESAREAN SECTION     CHOLECYSTECTOMY     MOUTH SURGERY     TUBAL LIGATION       OB History   No obstetric history on file.      Home Medications    Prior to Admission medications   Medication Sig Start Date End Date Taking? Authorizing Provider  cephALEXin (KEFLEX) 500 MG capsule Take 1 capsule (500 mg total) by mouth 2 (two) times daily. 10/17/18   Tyger Oka, Tinnie Gens, PA-C  metroNIDAZOLE (FLAGYL) 500 MG tablet Take 1 tablet (500 mg total) by mouth 2 (two) times daily. 10/17/18   Eyvonne Mechanic, PA-C    Family History Family History  Problem Relation Age of Onset   Hypertension Mother    Diabetes Mother    Hypertension Father    Diabetes Father     Social History Social History   Tobacco Use   Smoking status:  Former Smoker    Packs/day: 0.00    Last attempt to quit: 05/27/2015    Years since quitting: 3.3  Substance Use Topics   Alcohol use: No   Drug use: No     Allergies   Patient has no known allergies.   Review of Systems Review of Systems  All other systems reviewed and are negative.    Physical Exam Updated Vital Signs BP (!) 161/95    Pulse 94    Temp 98.2 F (36.8 C) (Oral)    Resp 18    Ht  (1.448 m)    Wt 54.4 kg    LMP 10/08/2018    SpO2 100%    BMI 25.97 kg/m   Physical Exam Vitals signs and nursing note reviewed.  Constitutional:      Appearance: She is well-developed.  HENT:     Head: Normocephalic and atraumatic.  Eyes:     General: No scleral icterus.       Right eye: No discharge.        Left eye: No discharge.     Conjunctiva/sclera: Conjunctivae normal.     Pupils: Pupils are equal, round, and reactive to light.  Neck:     Musculoskeletal: Normal range of motion.     Vascular: No JVD.  Trachea: No tracheal deviation.  Pulmonary:     Effort: Pulmonary effort is normal.     Breath sounds: No stridor.  Abdominal:     Comments: Tenderness palpation of right lower quadrant-minimal tenderness to palpation right upper quadrant remainder abdomen soft nontender  Genitourinary:    Comments: Watery grayish vaginal discharge, some minor discomfort with pelvic exam no cervical tenderness Neurological:     Mental Status: She is alert and oriented to person, place, and time.     Coordination: Coordination normal.  Psychiatric:        Behavior: Behavior normal.        Thought Content: Thought content normal.        Judgment: Judgment normal.      ED Treatments / Results  Labs (all labs ordered are listed, but only abnormal results are displayed) Labs Reviewed  WET PREP, GENITAL - Abnormal; Notable for the following components:      Result Value   Clue Cells Wet Prep HPF POC PRESENT (*)    WBC, Wet Prep HPF POC RARE (*)    All other components  within normal limits  CBC WITH DIFFERENTIAL/PLATELET - Abnormal; Notable for the following components:   Hemoglobin 11.1 (*)    MCH 24.9 (*)    MCHC 29.8 (*)    RDW 16.0 (*)    Platelets 408 (*)    All other components within normal limits  COMPREHENSIVE METABOLIC PANEL - Abnormal; Notable for the following components:   Calcium 8.8 (*)    All other components within normal limits  URINALYSIS, ROUTINE W REFLEX MICROSCOPIC - Abnormal; Notable for the following components:   APPearance CLOUDY (*)    Hgb urine dipstick SMALL (*)    Nitrite POSITIVE (*)    Bacteria, UA MANY (*)    All other components within normal limits  LIPASE, BLOOD  I-STAT BETA HCG BLOOD, ED ([facility], [facility], [facility] ONLY)  GC/CHLAMYDIA PROBE AMP ([address]) NOT AT [facility]    EKG None  Radiology Ct Abdomen Pelvis W Contrast  Result Date: 10/17/2018 CLINICAL DATA:  Abdominal pain, appendicitis suspected EXAM: CT ABDOMEN AND PELVIS WITH CONTRAST TECHNIQUE: Multidetector CT imaging of the abdomen and pelvis was performed using the standard protocol following bolus administration of intravenous contrast. CONTRAST:  ISOVUE-300 IOPAMIDOL (ISOVUE-300) INJECTION 61% COMPARISON:  None. FINDINGS: Lower chest: No acute abnormality. Hepatobiliary: No focal liver abnormality is seen. Status post cholecystectomy. No biliary dilatation. Pancreas: Unremarkable. No pancreatic ductal dilatation or surrounding inflammatory changes. Spleen: Normal in size without focal abnormality. Adrenals/Urinary Tract: Adrenal glands are unremarkable. Kidneys are normal, without renal calculi, focal lesion, or hydronephrosis. Bladder is unremarkable. Stomach/Bowel: Stomach is within normal limits. Normal appendix, which courses adjacent to the right ovary and adnexa. No evidence of bowel wall thickening, distention, or inflammatory changes. Vascular/Lymphatic: Mixed calcific atherosclerosis of abdominal aorta and iliac branch vessels. No enlarged abdominal or  pelvic lymph nodes. Reproductive: The ovaries are in an unusually high position in the bilateral lower abdominal quadrants, with multiple small cysts or follicles present bilaterally. Other: No abdominal wall hernia or abnormality. No abdominopelvic ascites. Musculoskeletal: No acute or significant osseous findings. IMPRESSION: 1. No definite CT findings of the abdomen or pelvis to explain right lower quadrant abdominal pain. Normal appendix, which courses adjacent to the right ovary and adnexa. 2. The ovaries are in an unusually high position in the bilateral lower abdominal quadrants, with multiple small cysts or follicles present bilaterally. 3. 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 4.  Status post cholecystectomy. Electronically Signed   By: Lauralyn Primes M.D.   On: 10/17/2018 11:27    Procedures Procedures (including critical care time)  Medications Ordered in ED Medications  morphine 4 MG/ML injection 4 mg (4 mg Intravenous Given 10/17/18 0951)  ondansetron (ZOFRAN) injection 4 mg (4 mg Intravenous Given 10/17/18 1016)  iopamidol (ISOVUE-300) 61 % injection 100 mL (100 mLs Intravenous Contrast Given 10/17/18 1119)  cefTRIAXone (ROCEPHIN) injection 250 mg (250 mg Intramuscular Given 10/17/18 1206)  azithromycin (ZITHROMAX) tablet 1,000 mg (1,000 mg Oral Given 10/17/18 1207)     Initial Impression / Assessment and Plan / ED Course  I have reviewed the triage vital signs and the nursing notes.  Pertinent labs & imaging results that were available during my care of the patient were reviewed by me and considered in my medical decision making (see chart for details).        Labs: CBC, CMP, i-STAT beta-hCG, wet prep, GC lipase  Imaging: CT abdomen pelvis with contrast  Consults:  Therapeutics: Metronidazole  Discharge Meds:   Assessment/Plan: 38 year old female presents today with abdominal pain.  Patient has  reassuring CT no acute abnormalities.  She does have lower abdominal tenderness and nitrite positive urine although this is negative for leukocytes.  Patient does have odorous urine.  She also has a vaginal discharge and is sexually active, she has no signs of pelvic inflammatory disease, she will be discharged home with metronidazole for the presumed bacterial vaginosis, she treated with ceftriaxone and azithromycin here.  She has no signs of ovarian torsion.  Patient will take medications as directed, return immediately if she develops any new or worsening signs or symptoms.  She verbalized understanding and agreement to today's plan.    Final Clinical Impressions(s) / ED Diagnoses   Final diagnoses:  Acute cystitis with hematuria  Vaginal discharge    ED Discharge Orders         Ordered    metroNIDAZOLE (FLAGYL) 500 MG tablet  2 times daily     10/17/18 1244    cephALEXin (KEFLEX) 500 MG capsule  2 times daily     10/17/18 1244           Eyvonne Mechanic, Cordelia Poche 10/17/18 1247    Mesner, Barbara Cower, MD 10/17/18 1343

## 2018-10-17 NOTE — Discharge Instructions (Addendum)
Please read attached information. If you experience any new or worsening signs or symptoms please return to the emergency room for evaluation. Please follow-up with your primary care provider or specialist as discussed. Please use medication prescribed only as directed and discontinue taking if you have any concerning signs or symptoms.   °

## 2018-10-17 NOTE — ED Notes (Signed)
Patient transported to CT 

## 2018-10-17 NOTE — ED Triage Notes (Signed)
Pt reports lower abd pain that feels like contractions for a week. Denies N/V/D.

## 2018-10-18 LAB — GC/CHLAMYDIA PROBE AMP (~~LOC~~) NOT AT ARMC
Chlamydia: NEGATIVE
Neisseria Gonorrhea: NEGATIVE

## 2019-03-18 ENCOUNTER — Emergency Department (HOSPITAL_COMMUNITY)
Admission: EM | Admit: 2019-03-18 | Discharge: 2019-03-18 | Disposition: A | Discharge status: Home / Self Care | Payer: Medicaid Other | Attending: Emergency Medicine | Admitting: Emergency Medicine

## 2019-03-18 ENCOUNTER — Other Ambulatory Visit: Payer: Self-pay

## 2019-03-18 ENCOUNTER — Encounter (HOSPITAL_COMMUNITY): Payer: Self-pay | Admitting: Emergency Medicine

## 2019-03-18 DIAGNOSIS — I10 Essential (primary) hypertension: Secondary | ICD-10-CM | POA: Insufficient documentation

## 2019-03-18 DIAGNOSIS — Z79899 Other long term (current) drug therapy: Secondary | ICD-10-CM | POA: Diagnosis not present

## 2019-03-18 DIAGNOSIS — Z87891 Personal history of nicotine dependence: Secondary | ICD-10-CM | POA: Insufficient documentation

## 2019-03-18 DIAGNOSIS — R590 Localized enlarged lymph nodes: Secondary | ICD-10-CM | POA: Insufficient documentation

## 2019-03-18 DIAGNOSIS — Y9389 Activity, other specified: Secondary | ICD-10-CM | POA: Diagnosis not present

## 2019-03-18 DIAGNOSIS — Y92410 Unspecified street and highway as the place of occurrence of the external cause: Secondary | ICD-10-CM | POA: Insufficient documentation

## 2019-03-18 DIAGNOSIS — M25512 Pain in left shoulder: Secondary | ICD-10-CM | POA: Insufficient documentation

## 2019-03-18 DIAGNOSIS — Y999 Unspecified external cause status: Secondary | ICD-10-CM | POA: Diagnosis not present

## 2019-03-18 HISTORY — DX: Other psychoactive substance abuse, uncomplicated: F19.10

## 2019-03-18 LAB — URINALYSIS, ROUTINE W REFLEX MICROSCOPIC
Bilirubin Urine: NEGATIVE
Glucose, UA: NEGATIVE mg/dL
Hgb urine dipstick: NEGATIVE
Ketones, ur: NEGATIVE mg/dL
Nitrite: NEGATIVE
Protein, ur: NEGATIVE mg/dL
Specific Gravity, Urine: 1.014 (ref 1.005–1.030)
pH: 6 (ref 5.0–8.0)

## 2019-03-18 MED ORDER — CEFTRIAXONE SODIUM 250 MG IJ SOLR
250.0000 mg | Freq: Once | INTRAMUSCULAR | Status: AC
  Administered 2019-03-18: 14:00:00 250 mg via INTRAMUSCULAR
  Filled 2019-03-18: qty 250

## 2019-03-18 MED ORDER — DOXYCYCLINE HYCLATE 100 MG PO CAPS: 100.0000 mg | ORAL_CAPSULE | Freq: Two times a day (BID) | ORAL | 0 refills | Status: DC

## 2019-03-18 MED ORDER — PREDNISONE 20 MG PO TABS
60.0000 mg | ORAL_TABLET | ORAL | Status: AC
  Administered 2019-03-18: 13:00:00 60 mg via ORAL
  Filled 2019-03-18: qty 3

## 2019-03-18 MED ORDER — STERILE WATER FOR INJECTION IJ SOLN
INTRAMUSCULAR | Status: AC
  Administered 2019-03-18: 14:00:00 1.3 mL
  Filled 2019-03-18: qty 10

## 2019-03-18 MED ORDER — DOXYCYCLINE HYCLATE 100 MG PO TABS: 100.0000 mg | ORAL_TABLET | Freq: Once | ORAL | Status: DC

## 2019-03-18 MED ORDER — DOXYCYCLINE HYCLATE 100 MG PO TABS
100.0000 mg | ORAL_TABLET | Freq: Once | ORAL | Status: AC
  Administered 2019-03-18: 14:00:00 100 mg via ORAL
  Filled 2019-03-18: qty 1
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
## 2019-03-18 NOTE — Discharge Instructions (Signed)
As discussed, today's evaluation has been generally reassuring, but follow-up he is required both in regards to your shoulder pain and for your swollen lymph node. Please take your medication as prescribed, and do not hesitate to return here for concerning changes in your condition.

## 2019-03-19 LAB — URINE CULTURE: Culture: 50000 — AB

## 2019-03-20 ENCOUNTER — Telehealth: Payer: Self-pay | Admitting: *Deleted

## 2019-03-20 NOTE — Telephone Encounter (Signed)
Post ED Visit - Positive Culture Follow-up  Culture report reviewed by antimicrobial stewardship pharmacist: [place] Team []  Elenor Quinones, Pharm.D. [x]  Heide Guile, Pharm.D., BCPS AQ-ID []  Parks Neptune, Pharm.D., BCPS []  Alycia Rossetti, Pharm.D., BCPS []  [place], Pharm.D., BCPS, AAHIVP []  Legrand Como, Pharm.D., BCPS, AAHIVP []  Salome Arnt, PharmD, BCPS []  Johnnette Gourd, PharmD, BCPS []  Hughes Better, PharmD, BCPS []  Leeroy Cha, PharmD []  Laqueta Linden, PharmD, BCPS []  Albertina Parr, PharmD  [place] Team []  Leodis Sias, PharmD []  Lindell Spar, PharmD []  Royetta Asal, PharmD []  Graylin Shiver, Rph []  Rema Fendt) Glennon Mac, PharmD []  Arlyn Dunning, PharmD []  Netta Cedars, PharmD []  Dia Sitter, PharmD []  Leone Haven, PharmD []  Gretta Arab, PharmD []  Theodis Shove, PharmD []  Peggyann Juba, PharmD []  Reuel Boom, PharmD   Positive urine culture Treated with Doxycycline, organism sensitive to the same and no further patient follow-up is required at this time.  Harlon Flor [facility] 03/20/2019, 10:39 AM

## 2019-07-30 ENCOUNTER — Ambulatory Visit: Payer: Medicaid Other | Attending: Internal Medicine

## 2019-07-30 DIAGNOSIS — Z20822 Contact with and (suspected) exposure to covid-19: Secondary | ICD-10-CM

## 2019-08-01 LAB — NOVEL CORONAVIRUS, NAA: SARS-CoV-2, NAA: NOT DETECTED

## 2019-08-30 LAB — HEPATITIS PANEL, ACUTE

## 2019-08-30 LAB — HIV ANTIBODY (ROUTINE TESTING W REFLEX)

## 2019-08-30 LAB — COMPREHENSIVE METABOLIC PANEL

## 2019-08-30 LAB — TSH

## 2019-08-30 LAB — HSV(HERPES SIMPLEX VRS) I + II AB-IGG

## 2019-08-30 LAB — LIPID PANEL W/O CHOL/HDL RATIO

## 2019-08-30 LAB — HGB A1C W/O EAG

## 2019-08-30 LAB — TRICHOMONAS VAGINALIS, PROBE AMP

## 2019-08-30 LAB — RPR QUALITATIVE

## 2019-08-30 LAB — CBC WITH DIFFERENTIAL/PLATELET

## 2019-12-05 ENCOUNTER — Other Ambulatory Visit: Payer: Self-pay

## 2019-12-05 ENCOUNTER — Encounter (HOSPITAL_COMMUNITY): Payer: Self-pay | Admitting: Emergency Medicine

## 2019-12-05 ENCOUNTER — Emergency Department (HOSPITAL_COMMUNITY)
Admission: EM | Admit: 2019-12-05 | Discharge: 2019-12-05 | Disposition: A | Discharge status: Home / Self Care | Payer: Medicaid Other | Attending: Emergency Medicine | Admitting: Emergency Medicine

## 2019-12-05 DIAGNOSIS — R197 Diarrhea, unspecified: Secondary | ICD-10-CM | POA: Insufficient documentation

## 2019-12-05 DIAGNOSIS — Z5321 Procedure and treatment not carried out due to patient leaving prior to being seen by health care provider: Secondary | ICD-10-CM | POA: Insufficient documentation

## 2019-12-05 MED ORDER — SODIUM CHLORIDE 0.9% FLUSH: 3.0000 mL | Freq: Once | INTRAVENOUS | Status: DC

## 2019-12-05 NOTE — ED Notes (Signed)
Called 3X for room call and to obtain labs. No response. Eloped from waiting room.

## 2019-12-05 NOTE — ED Notes (Signed)
Called in the lobby, no response x1.

## 2019-12-05 NOTE — ED Triage Notes (Signed)
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 Reports having diarrhea, not able to sleep and fatigued since.

## 2019-12-05 NOTE — ED Notes (Signed)
Called pt for labs and V/S recheck No answer x2

## 2020-06-16 IMAGING — CT CT ABDOMEN AND PELVIS WITH CONTRAST
2 of 4 series · 16 of 46 positions shown, 18 images · IV contrast (Omni 300)
Comparison: None.

CLINICAL DATA: Abdominal pain, appendicitis suspected

EXAM:
CT ABDOMEN AND PELVIS WITH CONTRAST
TECHNIQUE: Multidetector CT imaging of the abdomen and pelvis was performed
using the standard protocol following bolus administration of
intravenous contrast.
CONTRAST:  100mL BLIC77-7TT IOPAMIDOL (BLIC77-7TT) INJECTION 61%

[Series 3: a/p w/ 5mm · axial · 0.65mm/px · z∈[-336,+54]mm · 13 of 86 slices shown, 15 images]
[im 4/86  soft-tissue]
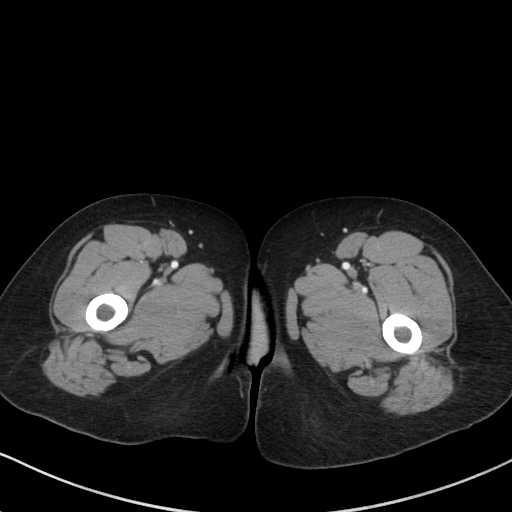
[im 4/86  bone]
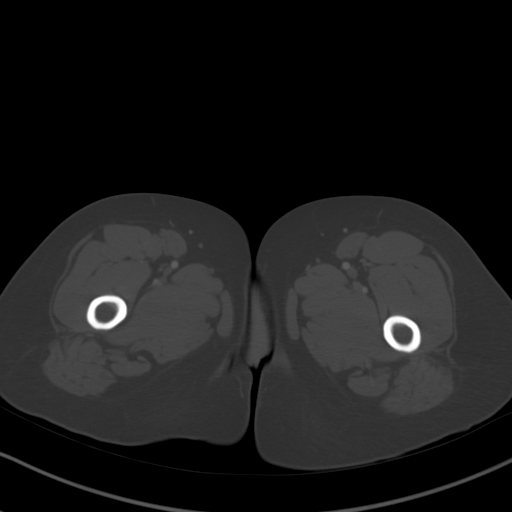
[im 11/86  soft-tissue]
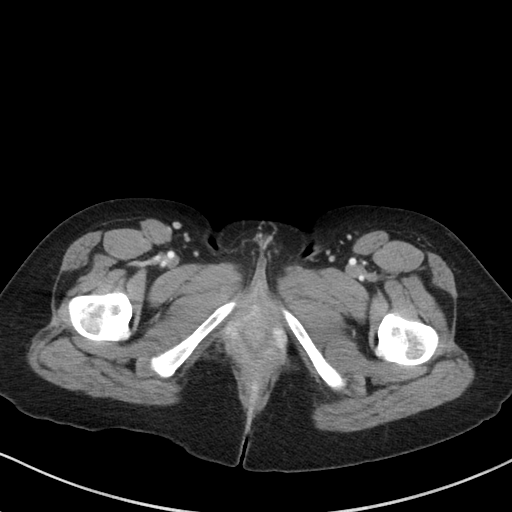
[im 18/86  soft-tissue]
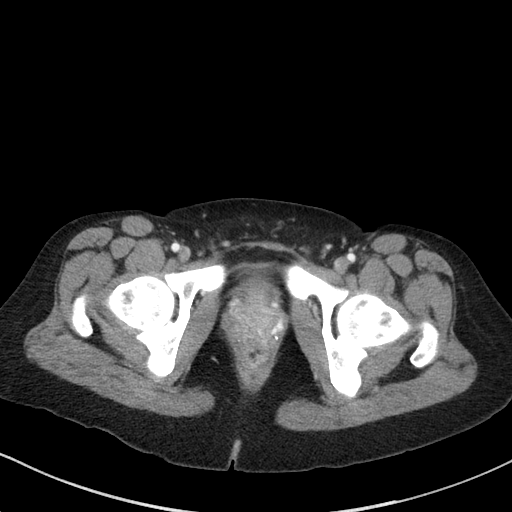
[im 24/86  soft-tissue]
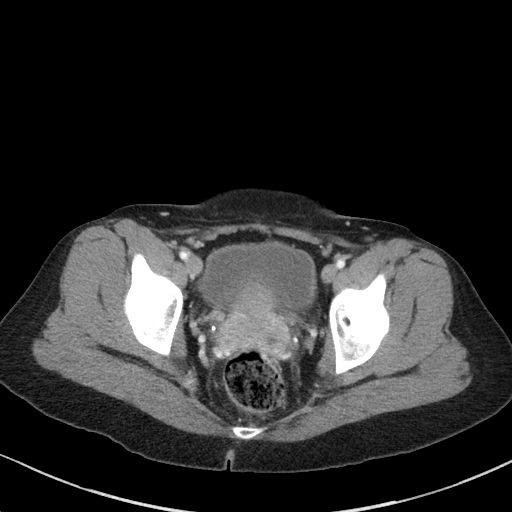
[im 31/86  soft-tissue]
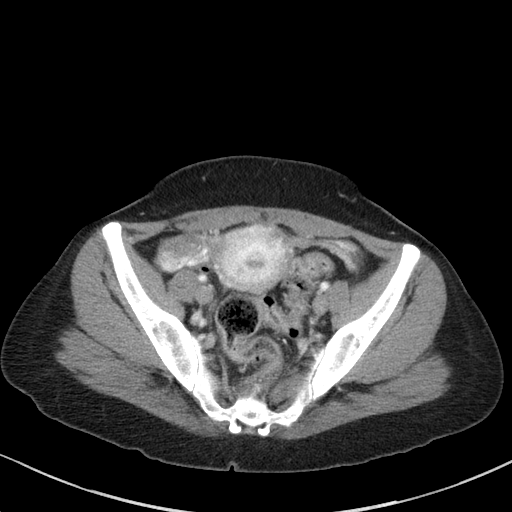
[im 38/86  soft-tissue]
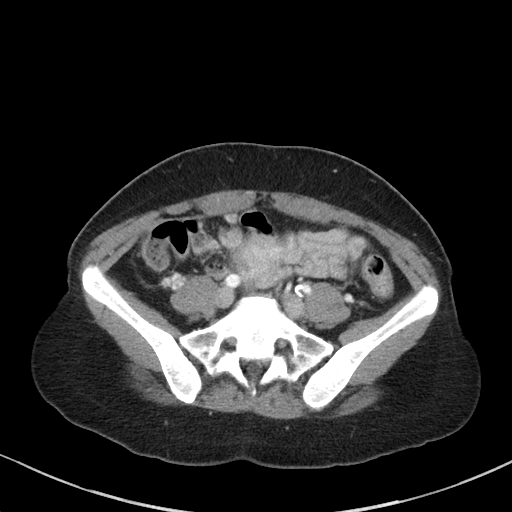
[im 45/86  soft-tissue]
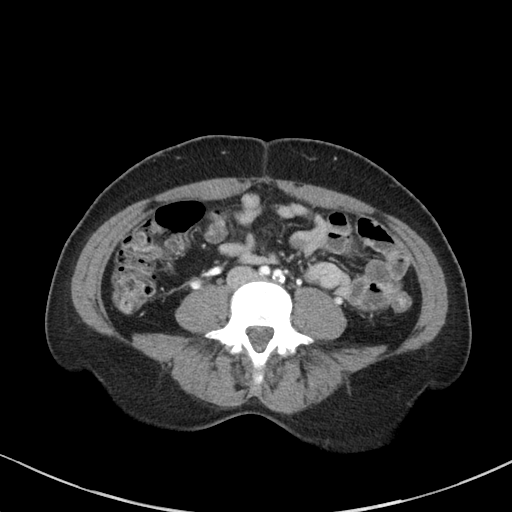
[im 48/86  soft-tissue]
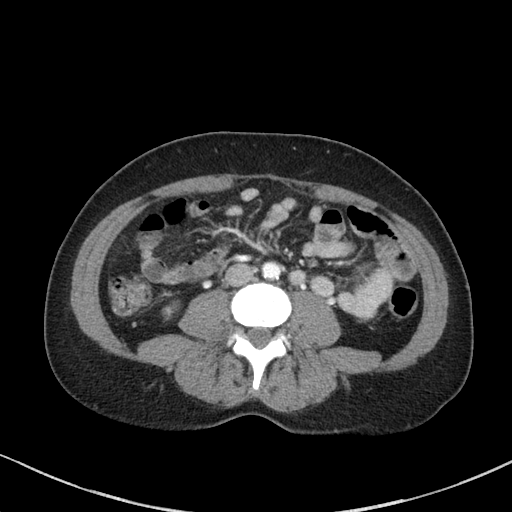
[im 55/86  soft-tissue]
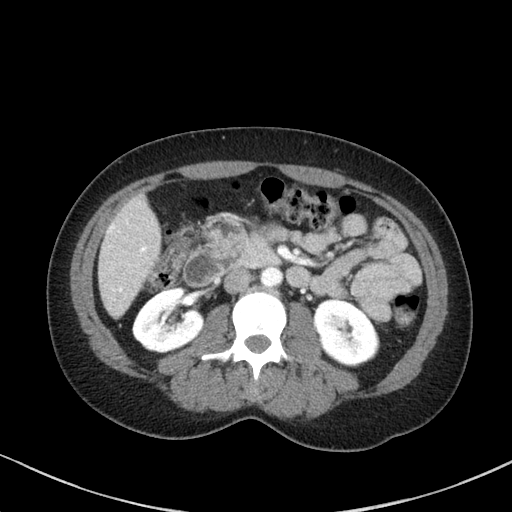
[im 55/86  bone]
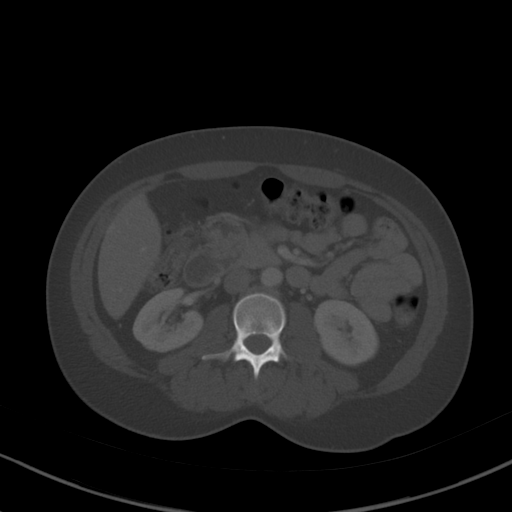
[im 62/86  soft-tissue]
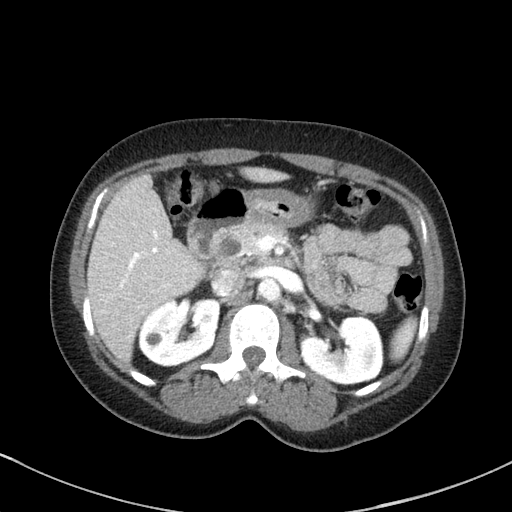
[im 69/86  soft-tissue]
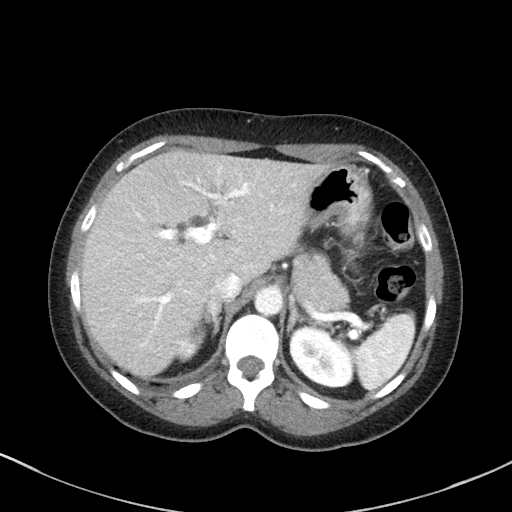
[im 75/86  soft-tissue]
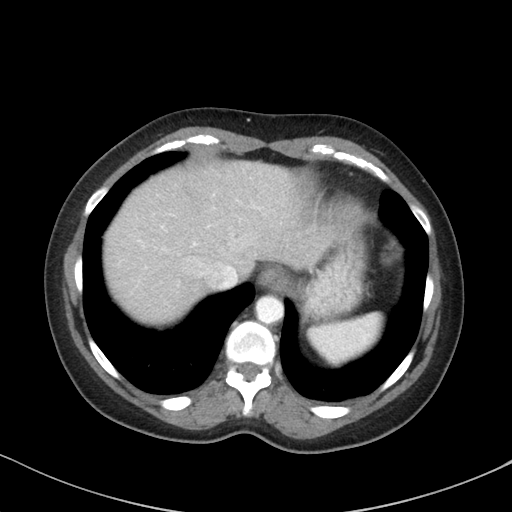
[im 82/86  soft-tissue]
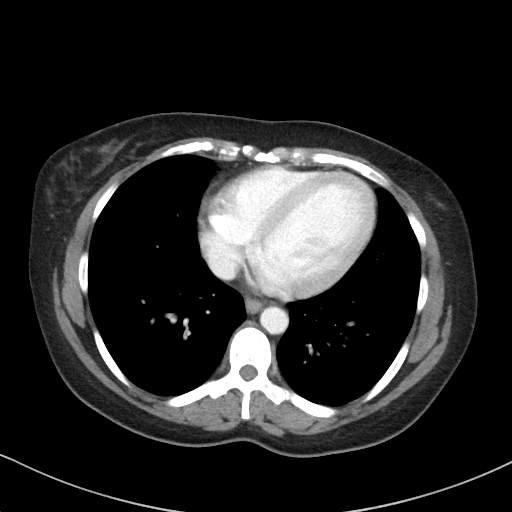

[Series 6: a/p w/ cor · coronal · 0.79mm/px · 3 of 138 slices shown]
[im 46/138  soft-tissue]
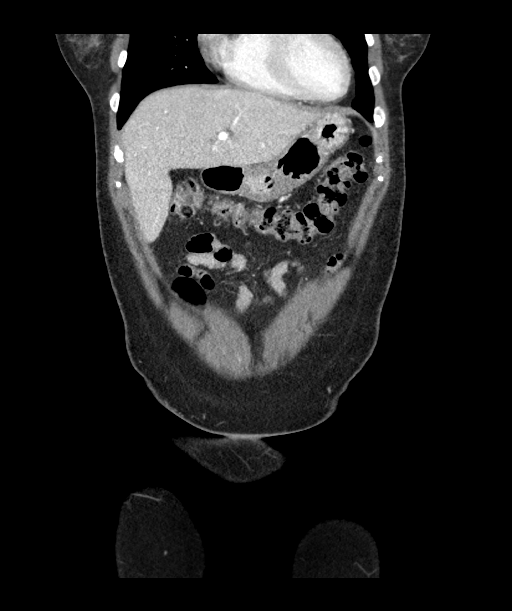
[im 61/138  soft-tissue]
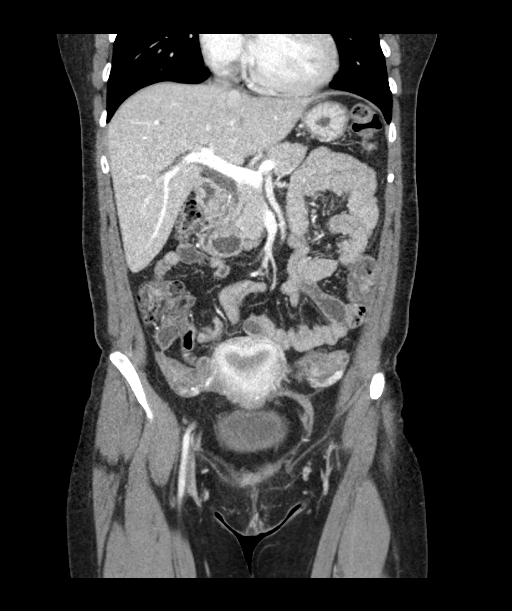
[im 77/138  soft-tissue]
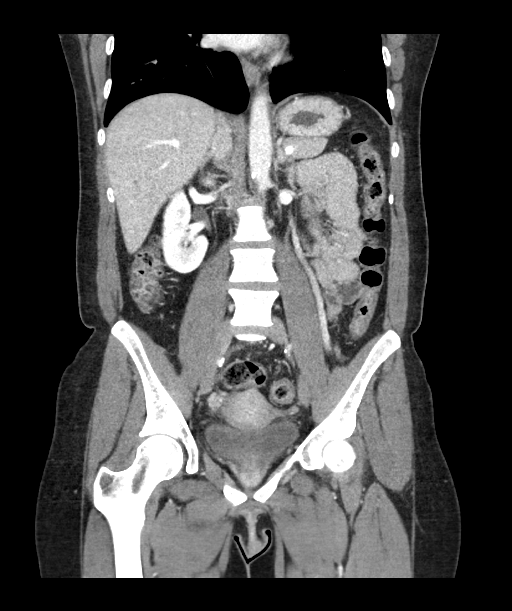

[16 of 46 positions shown; findings below may reference images not displayed]

FINDINGS: Lower chest: No acute abnormality.

Hepatobiliary: No focal liver abnormality is seen. Status post
cholecystectomy. No biliary dilatation.

Pancreas: Unremarkable. No pancreatic ductal dilatation or
surrounding inflammatory changes.

Spleen: Normal in size without focal abnormality.

Adrenals/Urinary Tract: Adrenal glands are unremarkable. Kidneys are
normal, without renal calculi, focal lesion, or hydronephrosis.
Bladder is unremarkable.

Stomach/Bowel: Stomach is within normal limits. Normal appendix,
which courses adjacent to the right ovary and adnexa. No evidence of
bowel wall thickening, distention, or inflammatory changes.

Vascular/Lymphatic: Mixed calcific atherosclerosis of abdominal
aorta and iliac branch vessels. No enlarged abdominal or pelvic
lymph nodes.

Reproductive: The ovaries are in an unusually high position in the
bilateral lower abdominal quadrants, with multiple small cysts or
follicles present bilaterally.

Other: No abdominal wall hernia or abnormality. No abdominopelvic
ascites.

Musculoskeletal: No acute or significant osseous findings.
IMPRESSION: 1. No definite CT findings of the abdomen or pelvis to explain right
lower quadrant abdominal pain. Normal appendix, which courses
adjacent to the right ovary and adnexa.

2. The ovaries are in an unusually high position in the bilateral
lower abdominal quadrants, with multiple small cysts or follicles
present bilaterally.

3. Mixed calcific atherosclerosis of abdominal aorta and iliac
branch vessels, which is unusually severe for patient age.

4.  Status post cholecystectomy.

## 2020-06-18 ENCOUNTER — Encounter (HOSPITAL_COMMUNITY): Payer: Self-pay | Admitting: Student

## 2020-06-18 ENCOUNTER — Other Ambulatory Visit: Payer: Self-pay

## 2020-06-18 ENCOUNTER — Emergency Department (HOSPITAL_COMMUNITY): Payer: Medicaid Other

## 2020-06-18 ENCOUNTER — Emergency Department (HOSPITAL_COMMUNITY)
Admission: EM | Admit: 2020-06-18 | Discharge: 2020-06-18 | Disposition: A | Discharge status: Home / Self Care | Payer: Medicaid Other | Attending: Emergency Medicine | Admitting: Emergency Medicine

## 2020-06-18 DIAGNOSIS — R0602 Shortness of breath: Secondary | ICD-10-CM | POA: Diagnosis not present

## 2020-06-18 DIAGNOSIS — Z87891 Personal history of nicotine dependence: Secondary | ICD-10-CM | POA: Diagnosis not present

## 2020-06-18 DIAGNOSIS — R531 Weakness: Secondary | ICD-10-CM | POA: Insufficient documentation

## 2020-06-18 DIAGNOSIS — R55 Syncope and collapse: Secondary | ICD-10-CM | POA: Insufficient documentation

## 2020-06-18 DIAGNOSIS — R072 Precordial pain: Secondary | ICD-10-CM | POA: Diagnosis not present

## 2020-06-18 DIAGNOSIS — I10 Essential (primary) hypertension: Secondary | ICD-10-CM | POA: Diagnosis not present

## 2020-06-18 DIAGNOSIS — R002 Palpitations: Secondary | ICD-10-CM | POA: Diagnosis present

## 2020-06-18 DIAGNOSIS — Z79899 Other long term (current) drug therapy: Secondary | ICD-10-CM | POA: Insufficient documentation

## 2020-06-18 LAB — CBC
HCT: 36.4 % (ref 36.0–46.0)
Hemoglobin: 12 g/dL (ref 12.0–15.0)
MCH: 30.4 pg (ref 26.0–34.0)
MCHC: 33 g/dL (ref 30.0–36.0)
MCV: 92.2 fL (ref 80.0–100.0)
Platelets: 266 10*3/uL (ref 150–400)
RBC: 3.95 MIL/uL (ref 3.87–5.11)
RDW: 14 % (ref 11.5–15.5)
WBC: 8.3 10*3/uL (ref 4.0–10.5)
nRBC: 0 % (ref 0.0–0.2)

## 2020-06-18 LAB — BASIC METABOLIC PANEL
Anion gap: 8 (ref 5–15)
BUN: 10 mg/dL (ref 6–20)
CO2: 25 mmol/L (ref 22–32)
Calcium: 8.8 mg/dL — ABNORMAL LOW (ref 8.9–10.3)
Chloride: 104 mmol/L (ref 98–111)
Creatinine, Ser: 0.67 mg/dL (ref 0.44–1.00)
GFR, Estimated: >60 mL/min (ref >60)
Glucose, Bld: 85 mg/dL (ref 70–99)
Potassium: 3.8 mmol/L (ref 3.5–5.1)
Sodium: 137 mmol/L (ref 135–145)

## 2020-06-18 LAB — I-STAT BETA HCG BLOOD, ED (NOT ORDERABLE): I-stat hCG, quantitative: <5 m[IU]/mL (ref <5)

## 2020-06-18 LAB — MAGNESIUM: Magnesium: 2 mg/dL (ref 1.7–2.4)

## 2020-06-18 LAB — TROPONIN I (HIGH SENSITIVITY): Troponin I (High Sensitivity): <2 ng/L (ref <18)

## 2020-06-18 NOTE — Discharge Instructions (Signed)
If symptoms get worse or the palpitations are persistent return to the ER.

## 2020-06-18 NOTE — ED Provider Notes (Signed)
[facility]-EMERGENCY DEPT Provider Note   CSN: [id number] Arrival date & time: 06/18/20  1548     History Chief Complaint  Patient presents with  . Chest Pain  . Weakness  . Near Syncope  . Shortness of Breath    Jamie Pratt is a 39 y.o. female.  Patient is a 39 year old female with a history of hypertension on metoprolol and lisinopril, mitral valve regurgitation and prior substance use who is presenting today with complaint of palpitations.  Patient reports that for years she has had intermittent palpitations but she has been having more frequent episodes in the last 4 days.  She reports that usually happens when she is just sitting around doing nothing and it can last 2 to 3 minutes.  She reports that suddenly her heart starts racing she gets a tight squeezing pain in her chest and feels short of breath.  Symptoms completely resolve when the racing heart resolves.  She reports at one point she did wear a monitor but it was several years ago and they did not find anything at the time.  She has not recently changed any of her medications and has been taking them for over a year.  She denies any stimulant use, caffeine or over-the-counter medications.  She does smoke cigarettes and marijuana but reports the racing heart is not associated when she smokes marijuana.  She denies any recent illness with cough, fever or congestion.  She denies any shortness of breath or abdominal pain.  She has no chest pain or palpitations at this time.  She was being followed at [facility] and getting routine echoes but has not had one in several years because her doctor left.  She does have insurance and reports that she needs to get a new doctor.  The history is provided by the patient.  Chest Pain Associated symptoms: near-syncope, shortness of breath and weakness   Weakness Associated symptoms: chest pain, near-syncope and shortness of breath   Near Syncope Associated  symptoms include chest pain and shortness of breath.  Shortness of Breath Associated symptoms: chest pain        Past Medical History:  Diagnosis Date  . Heart valve regurgitation   . Hypertension   . Murmur, heart   . Substance abuse (HCC)     There are no problems to display for this patient.   Past Surgical History:  Procedure Laterality Date  . CESAREAN SECTION    . CHOLECYSTECTOMY    . MOUTH SURGERY    . TUBAL LIGATION       OB History   No obstetric history on file.     Family History  Problem Relation Age of Onset  . Hypertension Mother   . Diabetes Mother   . Hypertension Father   . Diabetes Father     Social History   Tobacco Use  . Smoking status: Former Smoker    Packs/day: 0.00    Quit date: 05/27/2015    Years since quitting: 5.0  Substance Use Topics  . Alcohol use: No  . Drug use: Yes    Comment: opiods/methadone    Home Medications Prior to Admission medications   Medication Sig Start Date End Date Taking? Authorizing Provider  lisinopril (ZESTRIL) 40 MG tablet Take 40 mg by mouth daily. 06/06/20  Yes [provider]  metoprolol succinate (TOPROL-XL) 25 MG 24 hr tablet Take 25 mg by mouth daily. 06/06/20  Yes [provider]  Allergies    Patient has no known allergies.  Review of Systems   Review of Systems  Respiratory: Positive for shortness of breath.   Cardiovascular: Positive for chest pain and near-syncope.  Neurological: Positive for weakness.  All other systems reviewed and are negative.   Physical Exam Updated Vital Signs BP (!) 164/78   Pulse 65   Temp 97.9 F (36.6 C) (Oral)   Resp (!) 26   Ht 4\' 10"  (1.473 m)   Wt 47.6 kg   LMP 05/30/2020   SpO2 99%   BMI 21.95 kg/m   Physical Exam Vitals and nursing note reviewed.  Constitutional:      General: She is not in acute distress.    Appearance: She is well-developed and normal weight.  HENT:     Head: Normocephalic and atraumatic.   Eyes:     Pupils: Pupils are equal, round, and reactive to light.  Cardiovascular:     Rate and Rhythm: Normal rate and regular rhythm.     Heart sounds: Murmur heard.  Systolic murmur is present with a grade of 2/6.  No friction rub.  Pulmonary:     Effort: Pulmonary effort is normal.     Breath sounds: Normal breath sounds. No wheezing or rales.  Abdominal:     General: Bowel sounds are normal. There is no distension.     Palpations: Abdomen is soft.     Tenderness: There is no abdominal tenderness. There is no guarding or rebound.  Musculoskeletal:        General: No tenderness. Normal range of motion.     Right lower leg: No edema.     Left lower leg: No edema.     Comments: No edema  Skin:    General: Skin is warm and dry.     Findings: No rash.  Neurological:     General: No focal deficit present.     Mental Status: She is alert and oriented to person, place, and time. Mental status is at baseline.     Cranial Nerves: No cranial nerve deficit.  Psychiatric:        Mood and Affect: Mood normal.        Behavior: Behavior normal.        Thought Content: Thought content normal.      ED Results / Procedures / Treatments   Labs (all labs ordered are listed, but only abnormal results are displayed) Labs Reviewed  BASIC METABOLIC PANEL - Abnormal; Notable for the following components:      Result Value   Calcium 8.8 (*)    All other components within normal limits  CBC  MAGNESIUM  I-STAT BETA HCG BLOOD, ED ([facility], [facility], [facility] ONLY)  I-STAT BETA HCG BLOOD, ED (NOT ORDERABLE)  TROPONIN I (HIGH SENSITIVITY)  TROPONIN I (HIGH SENSITIVITY)    EKG EKG Interpretation  Date/Time:  Wednesday June 18 2020 15:57:20 EST Ventricular Rate:  74 PR Interval:    QRS Duration: 101 QT Interval:  366 QTC Calculation: 406 R Axis:   70 Text Interpretation: Sinus rhythm 12 Lead; Mason-Likar Normal ECG Nonspecific ST and T wave abnormality RESOLVED SINCE PREVIOUS Confirmed by  [id number] (Gwyneth Sprout) on 06/18/2020 5:06:04 PM   Radiology DG Chest 2 View  Result Date: 06/18/2020 CLINICAL DATA:  39 year old female with chest pain. EXAM: CHEST - 2 VIEW COMPARISON:  None. FINDINGS: No focal consolidation, pleural effusion, pneumothorax. The cardiac silhouette is within limits. No acute osseous pathology. IMPRESSION: No active cardiopulmonary  disease. Electronically Signed   By: Elgie Collard M.D.   On: 06/18/2020 16:37    Procedures Procedures (including critical care time)  Medications Ordered in ED Medications - No data to display  ED Course  I have reviewed the triage vital signs and the nursing notes.  Pertinent labs & imaging results that were available during my care of the patient were reviewed by me and considered in my medical decision making (see chart for details).    MDM Rules/Calculators/A&P                          Patient is a well-appearing 39 year old female presenting today with complaint of palpitations that have become more frequent in the last 4 days.  She is not having symptoms now.  Currently heart rate is in the 60s and 70s.  EKG without acute findings.  Electrolytes, renal function are all within normal limits.  Patient denies having any symptoms at this time.  She only gets tightness in the chest and shortness of breath when she is having the palpitations which usually resolve in less than 5 minutes.  It does not seem to be associated with activity.  Low suspicion that this is WPW and she has no evidence of prolonged QT on EKG or signs of Brugada.  She denies any syncope during these events.  She denies taking any recreational medication or stimulants that would result with palpitations.  Lab work is all within normal limits.  Did discuss with patient following up with cardiology for Zio patch or like monitor.  Also patient is due for another echo given her known mitral regurgitation.  She has no evidence of CHF or fluid overload at this  time.  Patient was discharged home in good condition.  No episodes while she was here and on the monitor.  MDM Number of Diagnoses or Management Options   Amount and/or Complexity of Data Reviewed Clinical lab tests: ordered and reviewed Tests in the radiology section of CPT: ordered and reviewed Tests in the medicine section of CPT: reviewed and ordered Decide to obtain previous medical records or to obtain history from someone other than the patient: yes Review and summarize past medical records: yes Discuss the patient with other providers: no Independent visualization of images, tracings, or specimens: yes  Risk of Complications, Morbidity, and/or Mortality Presenting problems: moderate Diagnostic procedures: minimal Management options: minimal  Patient Progress Patient progress: stable  Final Clinical Impression(s) / ED Diagnoses Final diagnoses:  Palpitations    Rx / DC Orders ED Discharge Orders    None       Gwyneth Sprout, MD 06/18/20 1842

## 2020-06-18 NOTE — ED Triage Notes (Signed)
Patient BIB POV c/o chest pain 10/10, SOB, dizziness, and feeling like something is squeezing her heart X2 weeks.  Patient saw her primary care MD this morning, and they sent her here.  Patient has hx HTN, heart murmur, and leaky valve.

## 2020-06-23 ENCOUNTER — Ambulatory Visit: Payer: Medicaid Other | Admitting: Internal Medicine

## 2020-06-23 ENCOUNTER — Encounter: Payer: Self-pay | Admitting: Internal Medicine

## 2020-06-23 ENCOUNTER — Encounter: Payer: Self-pay | Admitting: *Deleted

## 2020-06-23 ENCOUNTER — Other Ambulatory Visit: Payer: Self-pay

## 2020-06-23 VITALS — BP 124/70 | HR 93 | Ht <= 58 in | Wt 106.8 lb

## 2020-06-23 DIAGNOSIS — R011 Cardiac murmur, unspecified: Secondary | ICD-10-CM | POA: Diagnosis not present

## 2020-06-23 DIAGNOSIS — R002 Palpitations: Secondary | ICD-10-CM | POA: Diagnosis not present

## 2020-06-23 NOTE — Progress Notes (Signed)
Patient ID: Jamie Pratt, female   DOB: 1980-12-30, 39 y.o.   MRN: [id number] Patient enrolled for [employer] to ship a 14 day long term holter monitor to her home.

## 2020-06-23 NOTE — Progress Notes (Signed)
Cardiology Office Note   Date:  06/23/2020   ID:  Jamie Pratt, DOB December 29, 1980, MRN [id number]  PCP: [facility]    NEw     Pt referred by ED for eval of palpitations   History of Present Illness: Jamie Pratt is a 39 y.o. female with a history ofHTN, MV regurg and prior subastance abuse   Seen in [facility] ED on 06/18/20 for palpitations   She has had in past   More frequent over past 4 days leading up to ER visit  Wore a monitro several years ago but noothing found  No stimulant use .    Has had echo in past   None recently   Patient says she has had palpitations for years  Last a few min   Racing   WIll get dizzy   Abrupt on and off.   She wore a monitor in the past  Twice  Toma Copier)  Patient says she has had several times per month  This past 12/2 heart was racing AND had squeezing sensation in chest   Never had before   By time in ED was in SR   Pt has been on both BP meds for a couple years   Started on BP meds at age 13    Last seen a [facility] a long time     Seen now at [facility]    When not having spells says she feels tired, fatigued  Does walk some   Works in [employer]   On feet     Current Meds  Medication Sig  . lisinopril (ZESTRIL) 40 MG tablet Take 40 mg by mouth daily.  . metoprolol succinate (TOPROL-XL) 25 MG 24 hr tablet Take 25 mg by mouth daily.     Allergies:   Patient has no known allergies.   Past Medical History:  Diagnosis Date  . Heart valve regurgitation   . Hypertension   . Murmur, heart   . Substance abuse [facility])     Past Surgical History:  Procedure Laterality Date  . CESAREAN SECTION    . CHOLECYSTECTOMY    . MOUTH SURGERY    . TUBAL LIGATION       Social History:  The patient  reports that she quit smoking about 5 years ago. She smoked 0.00 packs per day. She has never used smokeless tobacco. She reports current drug use. She reports that she does not drink alcohol.   Family History:  The  patient's family history includes Diabetes in her father and mother; Hypertension in her father and mother.    ROS:  Please see the history of present illness. All other systems are reviewed and  Negative to the above problem except as noted.    PHYSICAL EXAM: VS:  BP 124/70   Pulse 93   Ht 4\' 10"  (1.473 m)   Wt 106 lb 12.8 oz (48.4 kg)   LMP 05/30/2020   SpO2 98%   BMI 22.32 kg/m    BP on my check:  110/70 (bilateral arms)   Pt did not take meds this AM   GEN: Well nourished, well developed, in no acute distress  HEENT: normal  Neck: no JVD, carotid bruits,  Cardiac: RRR; Gr I/VI systolic murmur LSB to apex  No LE  edema  Respiratory:  clear to auscultation bilaterally, normal work of breathing GI: soft, nontender, nondistended, + BS  No hepatomegaly  MS: no deformity Moving all extremities  Skin: warm and dry, no rash Neuro:  Strength and sensation are intact Psych: euthymic mood, full affect   EKG:  EKG is not  ordered today.  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  The patient does not have concerns regarding medicines.  Signed, Dietrich Pates, MD  06/23/2020  10:41 AM    [facility] [address] [place], [place], [place]  [address] Phone: [phone]; Fax: [phone]

## 2020-06-23 NOTE — Patient Instructions (Signed)
Medication Instructions:  Take 1/2 Toprol XL tablet today, take whole tablet tomorrow. Check BP, if BP 130s-140s or higher, go ahead and take 1/2 lisinopril.   Monitor BP at home and return for blood pressure check next Wednesday 07/02/20 at 11:00 am.  *If you need a refill on your cardiac medications before your next appointment, please call your pharmacy*   Lab Work: Today: cbc, bmet, tsh, cortisol, lipids  If you have labs (blood work) drawn today and your tests are completely normal, you will receive your results only by: Marland Kitchen MyChart Message (if you have MyChart) OR . A paper copy in the mail If you have any lab test that is abnormal or we need to change your treatment, we will call you to review the results.   Testing/Procedures: Your physician has requested that you have an echocardiogram. Echocardiography is a painless test that uses sound waves to create images of your heart. It provides your doctor with information about the size and shape of your heart and how well your heart's chambers and valves are working. This procedure takes approximately one hour. There are no restrictions for this procedure.  ZIO XT- Long Term Monitor Instructions   Your physician has requested you wear your ZIO patch monitor_______days.   This is a single patch monitor.  [employer] supplies one patch monitor per enrollment.  Additional stickers are not available.   Please do not apply patch if you will be having a Nuclear Stress Test, Echocardiogram, Cardiac CT, MRI, or Chest Xray during the time frame you would be wearing the monitor. The patch cannot be worn during these tests.  You cannot remove and re-apply the ZIO XT patch monitor.   Your ZIO patch monitor will be sent USPS Priority mail from [facility] directly to your home address. The monitor may also be mailed to a PO BOX if home delivery is not available.   It may take 3-5 days to receive your monitor after you have been enrolled.   Once  you have received you monitor, please review enclosed instructions.  Your monitor has already been registered assigning a specific monitor serial # to you.   Applying the monitor   Shave hair from upper left chest.   Hold abrader disc by orange tab.  Rub abrader in 40 strokes over left upper chest as indicated in your monitor instructions.   Clean area with 4 enclosed alcohol pads .  Use all pads to assure are is cleaned thoroughly.  Let dry.   Apply patch as indicated in monitor instructions.  Patch will be place under collarbone on left side of chest with arrow pointing upward.   Rub patch adhesive wings for 2 minutes.Remove white label marked "1".  Remove white label marked "2".  Rub patch adhesive wings for 2 additional minutes.   While looking in a mirror, press and release button in center of patch.  A small green light will flash 3-4 times .  This will be your only indicator the monitor has been turned on.     Do not shower for the first 24 hours.  You may shower after the first 24 hours.   Press button if you feel a symptom. You will hear a small click.  Record Date, Time and Symptom in the Patient Log Book.   When you are ready to remove patch, follow instructions on last 2 pages of Patient Log Book.  Stick patch monitor onto last page of Patient Log Book.  Place Patient Log Book in [place] box.  Use locking tab on box and tape box closed securely.  The [employer] and [employer] has [employer] on it.  Please place in mailbox as soon as possible.  Your physician should have your test results approximately 7 days after the monitor has been mailed back to [facility].   Call [facility] Customer Care at [phone] if you have questions regarding your ZIO XT patch monitor.  Call them immediately if you see an orange light blinking on your monitor.   If your monitor falls off in less than 4 days contact our Monitor department at [phone].  If your monitor becomes loose or  falls off after 4 days call [employer] at [phone] for suggestions on securing your monitor.     Follow-Up: BP check next Wed 07/02/20 at 11:00 am.   Other Instructions ROI signed to obtain records from [facility]

## 2020-06-24 LAB — BASIC METABOLIC PANEL
BUN/Creatinine Ratio: 14 (ref 9–23)
BUN: 10 mg/dL (ref 6–20)
CO2: 18 mmol/L — ABNORMAL LOW (ref 20–29)
Calcium: 9.5 mg/dL (ref 8.7–10.2)
Chloride: 107 mmol/L — ABNORMAL HIGH (ref 96–106)
Creatinine, Ser: 0.74 mg/dL (ref 0.57–1.00)
GFR calc Af Amer: 119 mL/min/{1.73_m2} (ref >59)
GFR calc non Af Amer: 103 mL/min/{1.73_m2} (ref >59)
Glucose: 80 mg/dL (ref 65–99)
Potassium: 4.1 mmol/L (ref 3.5–5.2)
Sodium: 139 mmol/L (ref 134–144)

## 2020-06-24 LAB — LIPID PANEL
Chol/HDL Ratio: 4.3 ratio (ref 0.0–4.4)
Cholesterol, Total: 181 mg/dL (ref 100–199)
HDL: 42 mg/dL (ref >39)
LDL Chol Calc (NIH): 120 mg/dL — ABNORMAL HIGH (ref 0–99)
Triglycerides: 106 mg/dL (ref 0–149)
VLDL Cholesterol Cal: 19 mg/dL (ref 5–40)

## 2020-06-24 LAB — CBC
Hematocrit: 41 % (ref 34.0–46.6)
Hemoglobin: 13.1 g/dL (ref 11.1–15.9)
MCH: 28.9 pg (ref 26.6–33.0)
MCHC: 32 g/dL (ref 31.5–35.7)
MCV: 91 fL (ref 79–97)
Platelets: 314 10*3/uL (ref 150–450)
RBC: 4.53 x10E6/uL (ref 3.77–5.28)
RDW: 13.8 % (ref 11.7–15.4)
WBC: 7.6 10*3/uL (ref 3.4–10.8)

## 2020-06-24 LAB — CORTISOL: Cortisol: 4.8 ug/dL

## 2020-06-24 LAB — TSH: TSH: 1.34 u[IU]/mL (ref 0.450–4.500)

## 2020-07-02 ENCOUNTER — Ambulatory Visit: Payer: Medicaid Other

## 2020-07-15 ENCOUNTER — Other Ambulatory Visit (HOSPITAL_COMMUNITY): Payer: Medicaid Other

## 2020-07-23 ENCOUNTER — Encounter (HOSPITAL_COMMUNITY): Payer: Self-pay | Admitting: Internal Medicine

## 2020-08-05 ENCOUNTER — Telehealth (HOSPITAL_COMMUNITY): Payer: Self-pay | Admitting: Internal Medicine

## 2020-08-05 NOTE — Telephone Encounter (Signed)
Just an FYI. We have made several attempts to contact this patient including sending a letter to schedule or reschedule their echocardiogram. We will be removing the patient from the echo WQ.    07/23/20 NO SHOWED 07/15/20 MAILED LETTER [facility]  07/15/20 No show evd    Thank you

## 2020-10-30 NOTE — Progress Notes (Signed)
Monitor was never returned and marked "lost" in the Zio system. Order will be cancelled 

## 2022-02-16 IMAGING — CR DG CHEST 2V
2 series · 2 of 2 positions shown · non-contrast
Comparison: None.

CLINICAL DATA: 39-year-old female with chest pain.

EXAM:
CHEST - 2 VIEW

[w chest pa]
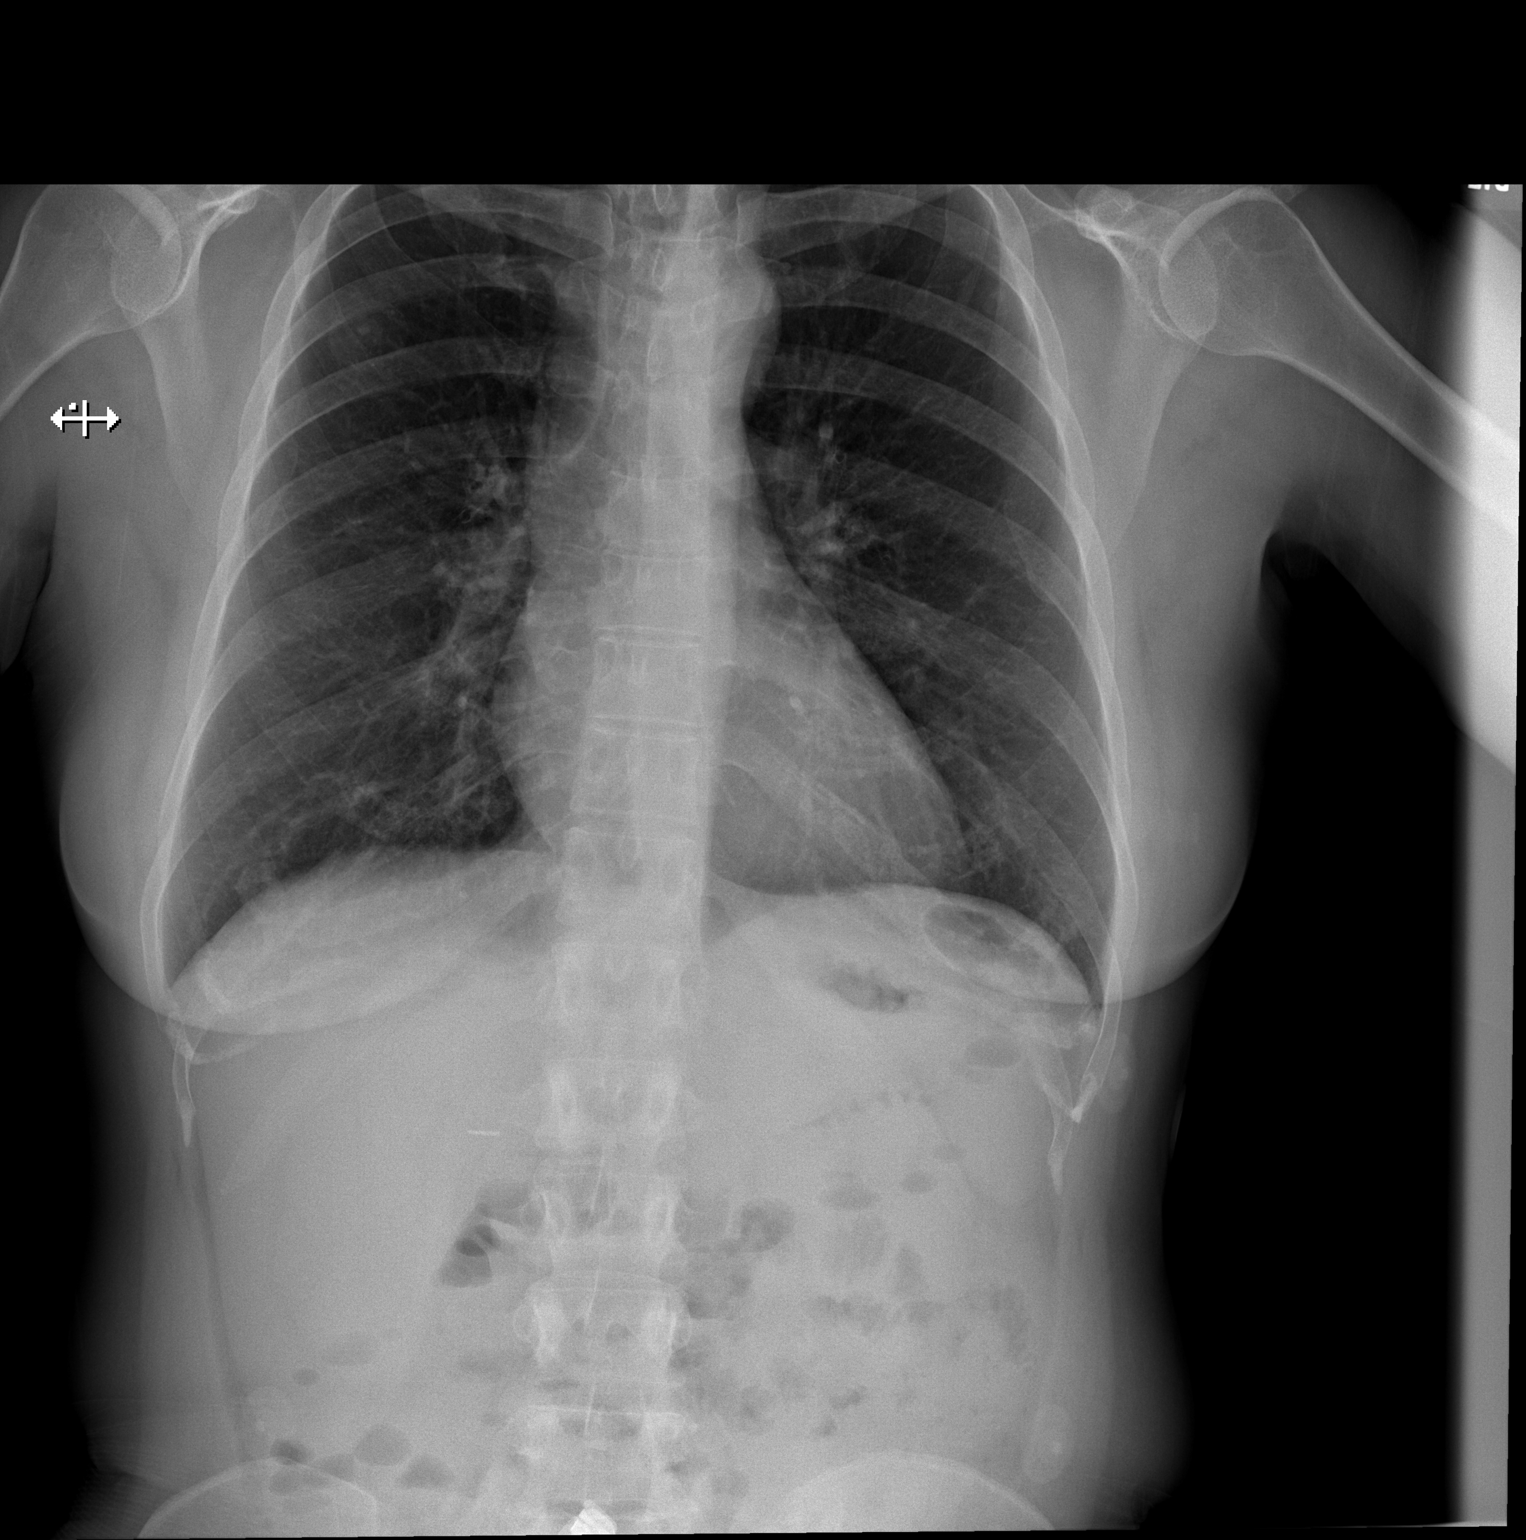

[w chest lat]
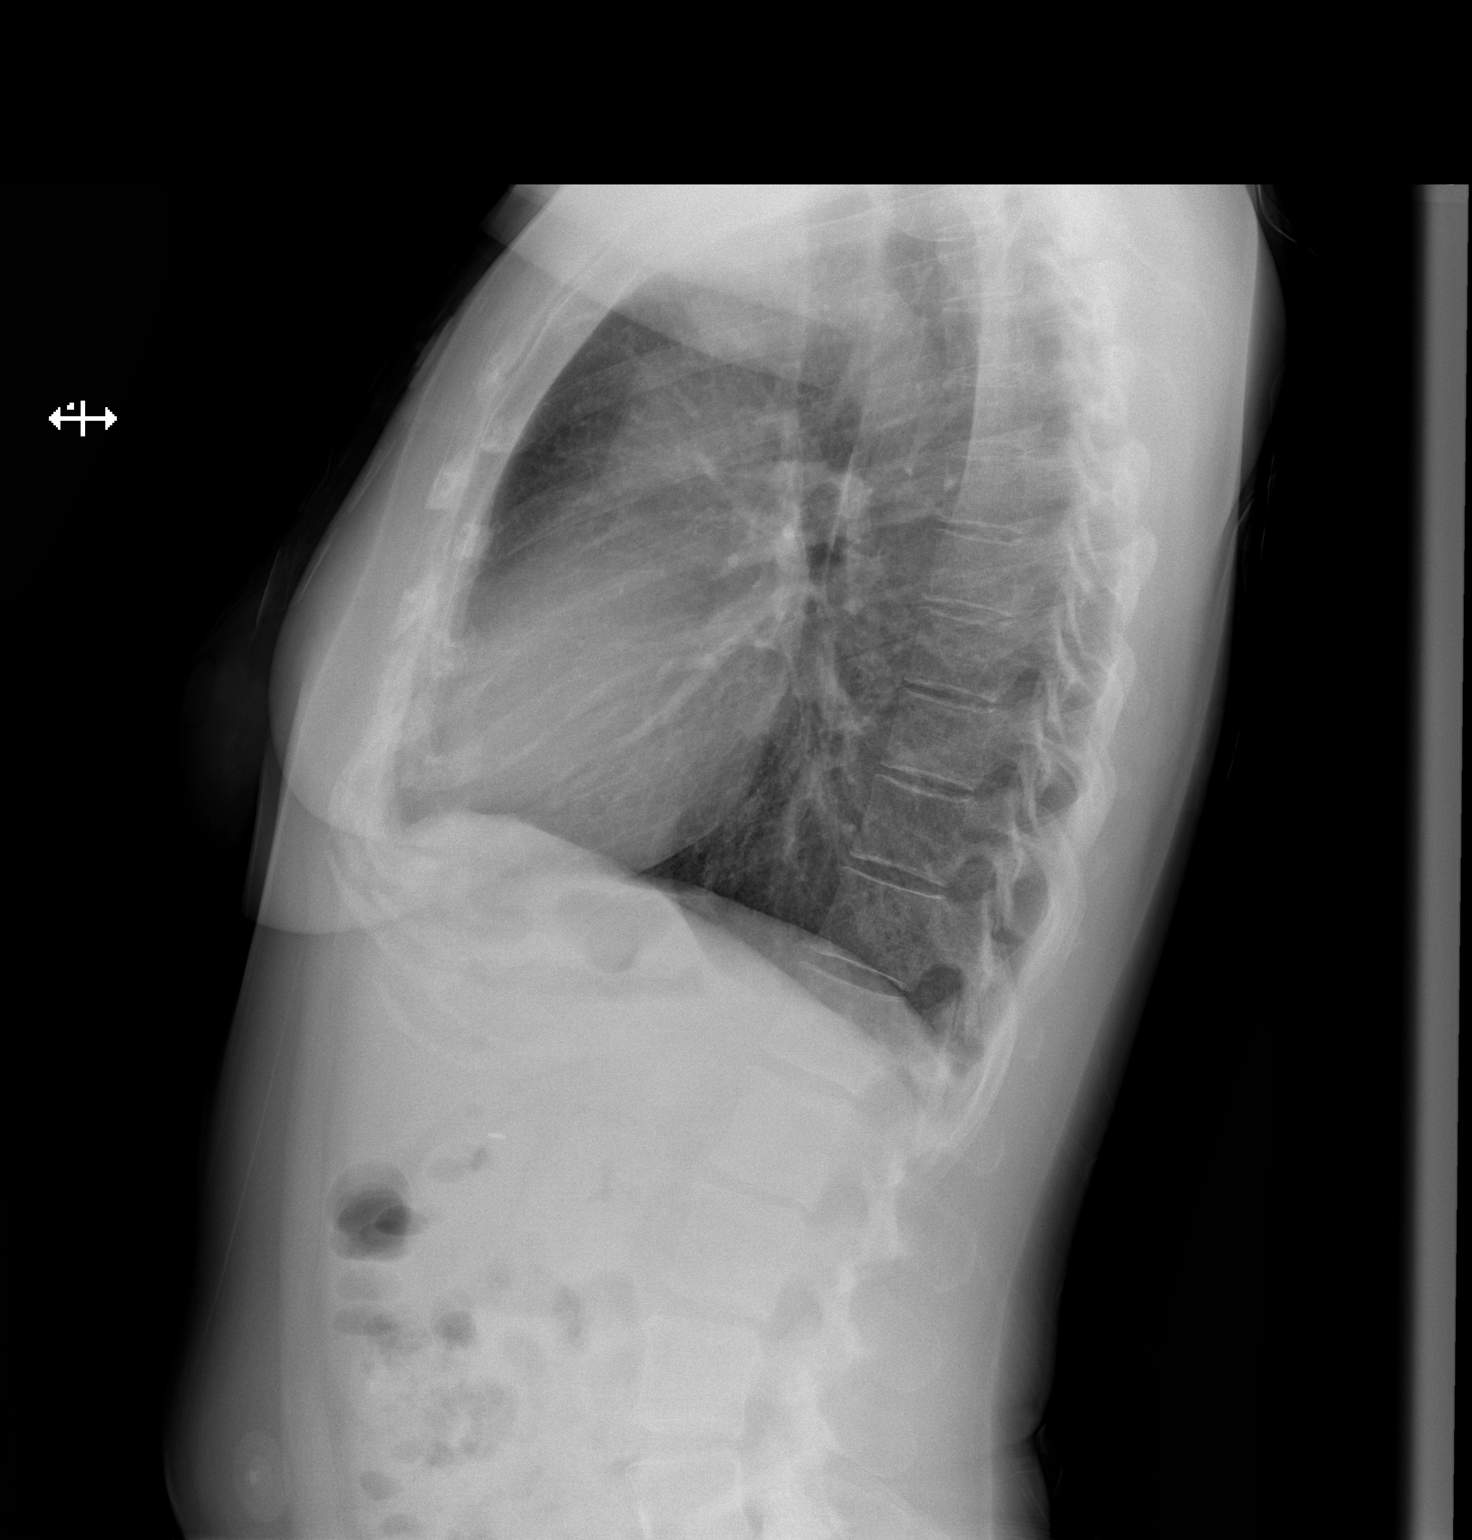

[2 of 2 positions shown; findings below may reference images not displayed]

FINDINGS: No focal consolidation, pleural effusion, pneumothorax. The cardiac
silhouette is within limits. No acute osseous pathology.
IMPRESSION: No active cardiopulmonary disease.
